# Patient Record
Sex: Female | Born: 1995 | Race: White | Hispanic: No | Marital: Single | Smoking: Never smoker
Health system: Southern US, Community
[De-identification: ages and names within clinical notes are randomized; demographics above are authoritative.]

## PROBLEM LIST (undated history)

## (undated) DIAGNOSIS — F419 Anxiety disorder, unspecified: Secondary | ICD-10-CM

## (undated) HISTORY — DX: Anxiety disorder, unspecified: F41.9

## (undated) HISTORY — PX: TONSILLECTOMY: SUR1361

---
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
## 2018-04-08 ENCOUNTER — Emergency Department
Admission: EM | Admit: 2018-04-08 | Discharge: 2018-04-08 | Disposition: A | Discharge status: Home / Self Care | Payer: 59 | Attending: Emergency Medicine | Admitting: Emergency Medicine

## 2018-04-08 ENCOUNTER — Other Ambulatory Visit: Payer: Self-pay

## 2018-04-08 ENCOUNTER — Encounter: Payer: Self-pay | Admitting: Emergency Medicine

## 2018-04-08 DIAGNOSIS — Y929 Unspecified place or not applicable: Secondary | ICD-10-CM | POA: Insufficient documentation

## 2018-04-08 DIAGNOSIS — S61314A Laceration without foreign body of right ring finger with damage to nail, initial encounter: Secondary | ICD-10-CM | POA: Insufficient documentation

## 2018-04-08 DIAGNOSIS — W231XXA Caught, crushed, jammed, or pinched between stationary objects, initial encounter: Secondary | ICD-10-CM | POA: Diagnosis not present

## 2018-04-08 DIAGNOSIS — S61309A Unspecified open wound of unspecified finger with damage to nail, initial encounter: Secondary | ICD-10-CM

## 2018-04-08 DIAGNOSIS — S6991XA Unspecified injury of right wrist, hand and finger(s), initial encounter: Secondary | ICD-10-CM | POA: Diagnosis present

## 2018-04-08 DIAGNOSIS — Y999 Unspecified external cause status: Secondary | ICD-10-CM | POA: Diagnosis not present

## 2018-04-08 DIAGNOSIS — Y9389 Activity, other specified: Secondary | ICD-10-CM | POA: Insufficient documentation

## 2018-04-08 MED ORDER — BUPIVACAINE HCL (PF) 0.5 % IJ SOLN
INTRAMUSCULAR | Status: AC
  Administered 2018-04-08: 30 mL
  Filled 2018-04-08: qty 30

## 2018-04-08 MED ORDER — BUPIVACAINE HCL (PF) 0.5 % IJ SOLN
30.0000 mL | Freq: Once | INTRAMUSCULAR | Status: AC
  Administered 2018-04-08: 30 mL

## 2018-04-08 NOTE — Discharge Instructions (Signed)
Please keep your nail taped down for the next few days.  It is possible that your nail will fall off however if it does it should not be painful.  We're trying to protect your skin by keeping the nail on as a biological Band-Aid.  Follow-up with your primary care physician as needed and return to the emergency department for any concerns.  It was a pleasure to take care of you today, and thank you for coming to our emergency department.  If you have any questions or concerns before leaving please ask the nurse to grab me and I'm more than happy to go through your aftercare instructions again.  If you have any concerns once you are home that you are not improving or are in fact getting worse before you can make it to your follow-up appointment, please do not hesitate to call 911 and come back for further evaluation.  Merrily Brittle, MD

## 2018-04-08 NOTE — ED Triage Notes (Signed)
Patient ambulatory to triage with steady gait, without difficulty or distress noted; pt reports caught right 4th finger on door, causing nailbed injury (has acrylic nails in place)

## 2018-04-08 NOTE — ED Provider Notes (Signed)
[facility] Emergency Department Provider Note  ____________________________________________   First MD Initiated Contact with Patient 04/08/18 [phone]     (approximate)  I have reviewed the triage vital signs and the nursing notes.   HISTORY  Chief Complaint Nail Problem    HPI Diana Hickman is a 22 y.o. female who comes to the emergency department with sudden onset severe pain to her right ring finger after avulsing back the nail shortly prior to arrival.  It did bleed briefly.  The pain is now worse  with movement and somewhat improved when not touching it.  She is wearing acrylic nails that she had placed for a recent wedding.  She is not used to having nails as long and this morning got caught and bent her nail backwards.  She is concerned that she is going to lose the entire nail.   History reviewed. No pertinent past medical history.  There are no active problems to display for this patient.   Past Surgical History:  Procedure Laterality Date  . TONSILLECTOMY      Prior to Admission medications   Not on File    Allergies Adhesive [tape]  No family history on file.  Social History Social History   Tobacco Use  . Smoking status: Never Smoker  . Smokeless tobacco: Never Used  Substance Use Topics  . Alcohol use: Not on file  . Drug use: Not on file    Review of Systems Constitutional: No fever/chills Cardiovascular: Denies chest pain. Respiratory: Denies shortness of breath. Gastrointestinal: No abdominal pain.  No nausea, no vomiting.   Musculoskeletal: Positive for nail pain Neurological: Negative for headaches   ____________________________________________   PHYSICAL EXAM:  VITAL SIGNS: ED Triage Vitals  Enc Vitals Group     BP 04/08/18 0500 (!) 127/92     Pulse Rate 04/08/18 0500 99     Resp 04/08/18 0500 20     Temp 04/08/18 0500 97.8 F (36.6 C)     Temp Source 04/08/18 0500 Oral     SpO2 04/08/18 0500 100  %     Weight 04/08/18 0500 175 lb (79.4 kg)     Height 04/08/18 0500 5\' 4"  (1.626 m)     Head Circumference --      Peak Flow --      Pain Score 04/08/18 0459 9     Pain Loc --      Pain Edu? --      Excl. in GC? --     Constitutional: Alert and oriented x4 tearful and quite uncomfortable appearing Head: Atraumatic. Cardiovascular: Regular rate and rhythm Respiratory: Normal respiratory effort.  No retractions. MSK: Exquisitely tender to right ring finger nail.  Dried blood around the edges.  On evaluation the patient does appear to have avulsed the nail although not completely and it comes back down nicely.  Neurovascularly intact Neurologic:  Normal speech and language. No gross focal neurologic deficits are appreciated.  Skin:  Skin is warm, dry and intact. No rash noted.    ____________________________________________  LABS (all labs ordered are listed, but only abnormal results are displayed)  Labs Reviewed - No data to display   __________________________________________  EKG   ____________________________________________  RADIOLOGY   ____________________________________________   DIFFERENTIAL includes but not limited to  Nail avulsion, subungual hematoma, nailbed laceration   PROCEDURES  Procedure(s) performed: Yes .Nerve Block Date/Time: 04/08/2018 8:23 AM Performed by: Merrily Brittle, MD Authorized by: Merrily Brittle, MD   Consent:  Consent obtained:  Verbal   Consent given by:  Patient   Risks discussed:  Allergic reaction, pain, nerve damage, unsuccessful block and swelling Indications:    Indications:  Pain relief and procedural anesthesia Location:    Body area:  Upper extremity   Upper extremity nerve blocked: Ring finger digital block.   Laterality:  Right Pre-procedure details:    Skin preparation:  Alcohol Skin anesthesia (see MAR for exact dosages):    Skin anesthesia method:  None Procedure details (see MAR for exact dosages):     Block needle gauge:  25 G   Anesthetic injected:  Bupivacaine 0.5% w/o epi   Steroid injected:  None   Additive injected:  None   Injection procedure:  Anatomic landmarks identified, anatomic landmarks palpated, incremental injection and negative aspiration for blood   Paresthesia:  Immediately resolved Post-procedure details:    Dressing:  None   Outcome:  Anesthesia achieved   Patient tolerance of procedure:  Tolerated well, no immediate complications .Nail Removal Date/Time: 04/08/2018 8:24 AM Performed by: Merrily Brittle, MD Authorized by: Merrily Brittle, MD   Consent:    Consent obtained:  Verbal   Consent given by:  Patient   Risks discussed:  Bleeding, pain and permanent nail deformity   Alternatives discussed:  Alternative treatment Location:    Hand:  R ring finger Pre-procedure details:    Skin preparation:  Alcohol Anesthesia (see MAR for exact dosages):    Anesthesia method:  Nerve block Nail Removal:    Nail removed:  Partial   Nail removed location: Trephination. Trephination:    Subungual hematoma drained: yes   Post-procedure details:    Patient tolerance of procedure:  Tolerated well, no immediate complications    Critical Care performed: no  ____________________________________________   INITIAL IMPRESSION / ASSESSMENT AND PLAN / ED COURSE  Pertinent labs & imaging results that were available during my care of the patient were reviewed by me and considered in my medical decision making (see chart for details).   As part of my medical decision making, I reviewed the following data within the electronic MEDICAL RECORD NUMBER History obtained from family if available, nursing notes, old chart and ekg, as well as notes from prior ED visits.  On arrival it was quite difficult to evaluate the patient secondary to pain.  I verbally consented her for digital block and obtained near complete anesthesia using bupivacaine.  I subsequently was able to evaluate and  the patient clearly did avulsed her nail partially although not completely.  It is difficult to tell if there is a subungual hematoma given her acrylic nails however given my concern I performed electrocautery trephination and did express a small amount of subungual hematoma.  I appreciate that she has a reported allergy to tape however she said the paper tape should be fine and I taped down her fingernail with improvement in her symptoms.  I explained to the patient that she very well might lose her nail however right now with the biological splint and I do not think she would benefit from removal.  We will refer her back to primary care.  Strict return precautions been given.      ____________________________________________   FINAL CLINICAL IMPRESSION(S) / ED DIAGNOSES  Final diagnoses:  Avulsion of fingernail, initial encounter      NEW MEDICATIONS STARTED DURING THIS VISIT:  There are no discharge medications for this patient.    Note:  This document was prepared using [employer]  software and may include unintentional dictation errors.      Merrily Brittle, MD 04/08/18 [phone]

## 2019-01-29 ENCOUNTER — Other Ambulatory Visit: Payer: Self-pay | Admitting: *Deleted

## 2019-01-29 DIAGNOSIS — Z20822 Contact with and (suspected) exposure to covid-19: Secondary | ICD-10-CM

## 2019-01-31 LAB — NOVEL CORONAVIRUS, NAA: SARS-CoV-2, NAA: DETECTED — AB

## 2019-03-18 DIAGNOSIS — F419 Anxiety disorder, unspecified: Secondary | ICD-10-CM | POA: Insufficient documentation

## 2019-03-18 DIAGNOSIS — F32A Depression, unspecified: Secondary | ICD-10-CM | POA: Insufficient documentation

## 2019-03-18 DIAGNOSIS — R1084 Generalized abdominal pain: Secondary | ICD-10-CM | POA: Insufficient documentation

## 2020-06-28 ENCOUNTER — Encounter: Payer: Self-pay | Admitting: Obstetrics and Gynecology

## 2020-06-28 ENCOUNTER — Other Ambulatory Visit (HOSPITAL_COMMUNITY)
Admission: RE | Admit: 2020-06-28 | Discharge: 2020-06-28 | Disposition: A | Discharge status: Home / Self Care | Payer: Commercial Managed Care - PPO | Source: Ambulatory Visit | Attending: Obstetrics and Gynecology | Admitting: Obstetrics and Gynecology

## 2020-06-28 ENCOUNTER — Ambulatory Visit (INDEPENDENT_AMBULATORY_CARE_PROVIDER_SITE_OTHER): Payer: Commercial Managed Care - PPO | Admitting: Obstetrics and Gynecology

## 2020-06-28 ENCOUNTER — Other Ambulatory Visit: Payer: Self-pay

## 2020-06-28 VITALS — BP 115/70 | Ht 65.0 in | Wt 206.2 lb

## 2020-06-28 DIAGNOSIS — Z124 Encounter for screening for malignant neoplasm of cervix: Secondary | ICD-10-CM

## 2020-06-28 DIAGNOSIS — Z1231 Encounter for screening mammogram for malignant neoplasm of breast: Secondary | ICD-10-CM | POA: Diagnosis not present

## 2020-06-28 DIAGNOSIS — Z01419 Encounter for gynecological examination (general) (routine) without abnormal findings: Secondary | ICD-10-CM | POA: Diagnosis not present

## 2020-06-28 DIAGNOSIS — Z0000 Encounter for general adult medical examination without abnormal findings: Secondary | ICD-10-CM

## 2020-06-28 DIAGNOSIS — Z113 Encounter for screening for infections with a predominantly sexual mode of transmission: Secondary | ICD-10-CM | POA: Insufficient documentation

## 2020-06-28 NOTE — Progress Notes (Signed)
Gynecology Annual Exam  PCP: Mick Sell, MD  Chief Complaint:  Chief Complaint  Patient presents with  . Gynecologic Exam    Annual exam     History of Present Illness: Patient is a 25 y.o. No obstetric history on file. presents for annual exam. The patient has no complaints today.   LMP: Patient's last menstrual period was 06/23/2020. Generally has amenorrhea with Nexplanon, reports it was inserted in January 2018 Duration of flow: 0 days Heavy Menses: no Intermenstrual Bleeding: no Dysmenorrhea: no    The patient does perform self breast exams.  There is notable family history of breast or ovarian cancer in her family. Paternal Aunt with ovarian cancer  The patient reports her exercise generally consists of "not much".  The patient reports current symptoms of depression and anxiety.   PHQ-9: 13 GAD-7: 19   Review of Systems: Review of Systems  Constitutional: Negative for chills, fever, malaise/fatigue and weight loss.  HENT: Negative for congestion, hearing loss and sinus pain.   Eyes: Negative for blurred vision and double vision.  Respiratory: Negative for cough, sputum production, shortness of breath and wheezing.   Cardiovascular: Negative for chest pain, palpitations, orthopnea and leg swelling.  Gastrointestinal: Negative for abdominal pain, constipation, diarrhea, nausea and vomiting.  Genitourinary: Negative for dysuria, flank pain, frequency, hematuria and urgency.  Musculoskeletal: Negative for back pain, falls and joint pain.  Skin: Negative for itching and rash.  Neurological: Negative for dizziness and headaches.  Psychiatric/Behavioral: Negative for depression, substance abuse and suicidal ideas. The patient is not nervous/anxious.     Past Medical History:  Past Medical History:  Diagnosis Date  . Anxiety     Past Surgical History:  Past Surgical History:  Procedure Laterality Date  . TONSILLECTOMY      Gynecologic History:   Patient's last menstrual period was 06/23/2020. Menarche: 12  History of fibroids, polyps, or ovarian cysts? :   denies History of PCOS? denies Hstory of Endometriosis? Denies- reports she was told by an ER doctor in 2020 that her CT showed she might have endometriosis History of abnormal pap smears? denies Have you had any sexually transmitted infections in the past?  Chlamydia in 2016  She deneis HPV vaccination in the past.   Last Pap: Results were: 06/03/2016- normal per patient  She identifies as a female. She is sexually active with men.   She denies dyspareunia. She denies postcoital bleeding.  She currently uses Nexplanon for contraception.    Obstetric History: No obstetric history on file.  Family History:  History reviewed. No pertinent family history.  Social History:  Social History   Socioeconomic History  . Marital status: Single    Spouse name: Not on file  . Number of children: Not on file  . Years of education: Not on file  . Highest education level: Not on file  Occupational History  . Not on file  Tobacco Use  . Smoking status: Never Smoker  . Smokeless tobacco: Never Used  Vaping Use  . Vaping Use: Never used  Substance and Sexual Activity  . Alcohol use: Yes    Comment: occ  . Drug use: Never  . Sexual activity: Yes    Birth control/protection: None  Other Topics Concern  . Not on file  Social History Narrative  . Not on file   Social Determinants of Health   Financial Resource Strain: Not on file  Food Insecurity: Not on file  Transportation Needs: Not on  file  Physical Activity: Not on file  Stress: Not on file  Social Connections: Not on file  Intimate Partner Violence: Not on file    Allergies:  Allergies  Allergen Reactions  . Adhesive [Tape]     Medications: Prior to Admission medications   Medication Sig Start Date End Date Taking? Authorizing Provider  ALPRAZolam Prudy Feeler) 1 MG tablet Take 1 mg by mouth at bedtime as  needed for anxiety.   Yes [provider]  citalopram (CELEXA) 20 MG tablet Take 20 mg by mouth daily.   Yes [provider]    Physical Exam Vitals: Blood pressure 115/70, height 5\' 5"  (1.651 m), weight 206 lb 3.2 oz (93.5 kg), last menstrual period 06/23/2020.  Physical Exam Constitutional:      Appearance: She is well-developed.  Genitourinary:     Vagina and uterus normal.     There is no lesion on the right labia.     There is no lesion on the left labia.    No lesions in the vagina.     Genitourinary Comments: External: Normal appearing vulva. No lesions noted.  Speculum examination: Normal appearing cervix. No blood in the vaginal vault. No discharge.   Bimanual examination: Uterus midline, non-tender, normal in size, shape and contour.  No CMT. No adnexal masses. No adnexal tenderness. Pelvis not fixed.       Right Adnexa: no mass present.    Left Adnexa: no mass present.    No cervical motion tenderness.  Breasts:     Right: No inverted nipple, mass, nipple discharge or skin change.     Left: No inverted nipple, mass, nipple discharge or skin change.    HENT:     Head: Normocephalic and atraumatic.  Eyes:     Extraocular Movements: EOM normal.  Neck:     Thyroid: No thyromegaly.  Cardiovascular:     Rate and Rhythm: Normal rate and regular rhythm.     Heart sounds: Normal heart sounds.  Pulmonary:     Effort: Pulmonary effort is normal.     Breath sounds: Normal breath sounds.  Abdominal:     General: Bowel sounds are normal. There is no distension.     Palpations: Abdomen is soft. There is no mass.  Musculoskeletal:     Cervical back: Neck supple.  Neurological:     Mental Status: She is alert and oriented to person, place, and time.  Skin:    General: Skin is warm and dry.  Psychiatric:        Mood and Affect: Mood and affect normal.        Behavior: Behavior normal.        Thought Content: Thought content normal.        Judgment:  Judgment normal.  Vitals reviewed.      Female chaperone present for pelvic and breast  portions of the physical exam  Assessment: 25 y.o. No obstetric history on file. routine annual exam  Plan: Problem List Items Addressed This Visit   None   Visit Diagnoses    Encounter for annual routine gynecological examination    -  Primary   Health maintenance examination       Breast cancer screening by mammogram       Cervical cancer screening       Relevant Orders   Cytology - PAP   Screen for STD (sexually transmitted disease)       Relevant Orders   Cytology - PAP  1) STI screening was offered and accepted  2) ASCCP guidelines and rational discussed.  Patient opts for every 3 years screening interval  3) Contraception - would like to return for a Nexplanon exchange. Was offered today but felt too anxious  4) Routine healthcare maintenance including cholesterol, diabetes screening discussed managed by PCP  5) Anxiety and depression- discussed local mental health resources and provided with list.  Managed by PCP currently.   Adelene Idler MD, [facility], [facility] 06/28/2020 4:06 PM

## 2020-06-28 NOTE — Progress Notes (Signed)
Annual Exam

## 2020-06-28 NOTE — Patient Instructions (Addendum)
[facility] [place] for Calcium and Vitamin D  Age (yr) Calcium Recommended Dietary Allowance (mg/day) Vitamin D Recommended Dietary Allowance (international units/day)  9-18 1,300 600  19-50 1,000 600  51-70 1,200 600  71 and older 1,200 800  Data from Institute of Medicine. Dietary reference intakes: calcium, vitamin D. [place], [place]: [employer]; 2011.      Exercising to Stay Healthy To become healthy and stay healthy, it is recommended that you do moderate-intensity and vigorous-intensity exercise. You can tell that you are exercising at a moderate intensity if your heart starts beating faster and you start breathing faster but can still hold a conversation. You can tell that you are exercising at a vigorous intensity if you are breathing much harder and faster and cannot hold a conversation while exercising. Exercising regularly is important. It has many health benefits, such as:  Improving overall fitness, flexibility, and endurance.  Increasing bone density.  Helping with weight control.  Decreasing body fat.  Increasing muscle strength.  Reducing stress and tension.  Improving overall health. How often should I exercise? Choose an activity that you enjoy, and set realistic goals. Your health care provider can help you make an activity plan that works for you. Exercise regularly as told by your health care provider. This may include:  Doing strength training two times a week, such as: ? Lifting weights. ? Using resistance bands. ? Push-ups. ? Sit-ups. ? Yoga.  Doing a certain intensity of exercise for a given amount of time. Choose from these options: ? A total of 150 minutes of moderate-intensity exercise every week. ? A total of 75 minutes of vigorous-intensity exercise every week. ? A mix of moderate-intensity and vigorous-intensity exercise every week. Children, pregnant women, people who have not  exercised regularly, people who are overweight, and older adults may need to talk with a health care provider about what activities are safe to do. If you have a medical condition, be sure to talk with your health care provider before you start a new exercise program. What are some exercise ideas? Moderate-intensity exercise ideas include:  Walking 1 mile (1.6 km) in about 15 minutes.  Biking.  Hiking.  Golfing.  Dancing.  Water aerobics. Vigorous-intensity exercise ideas include:  Walking 4.5 miles (7.2 km) or more in about 1 hour.  Jogging or running 5 miles (8 km) in about 1 hour.  Biking 10 miles (16.1 km) or more in about 1 hour.  Lap swimming.  Roller-skating or in-line skating.  Cross-country skiing.  Vigorous competitive sports, such as football, basketball, and soccer.  Jumping rope.  Aerobic dancing.   What are some everyday activities that can help me to get exercise?  [place] work, such as: ? Pushing a Conservation officer, nature. ? Raking and bagging leaves.  Washing your car.  Pushing a stroller.  Shoveling snow.  Gardening.  Washing windows or floors. How can I be more active in my day-to-day activities?  Use stairs instead of an elevator.  Take a walk during your lunch break.  If you drive, park your car farther away from your work or school.  If you take public transportation, get off one stop early and walk the rest of the way.  Stand up or walk around during all of your indoor phone calls.  Get up, stretch, and walk around every 30 minutes throughout the day.  Enjoy exercise with a friend. Support to continue exercising will help you keep a regular routine of activity.  What guidelines can I follow while exercising?  Before you start a new exercise program, talk with your health care provider.  Do not exercise so much that you hurt yourself, feel dizzy, or get very short of breath.  Wear comfortable clothes and wear shoes with good support.  Drink  plenty of water while you exercise to prevent dehydration or heat stroke.  Work out until your breathing and your heartbeat get faster. Where to find more information  U.S. Department of Health and Human Services: BondedCompany.at  Centers for Disease Control and Prevention (CDC): http://www.wolf.info/ Summary  Exercising regularly is important. It will improve your overall fitness, flexibility, and endurance.  Regular exercise also will improve your overall health. It can help you control your weight, reduce stress, and improve your bone density.  Do not exercise so much that you hurt yourself, feel dizzy, or get very short of breath.  Before you start a new exercise program, talk with your health care provider. This information is not intended to replace advice given to you by your health care provider. Make sure you discuss any questions you have with your health care provider. Document Revised: 05/02/2017 Document Reviewed: 04/10/2017 Elsevier Patient Education  2021 [place].   Budget-Friendly Healthy Eating There are many ways to save money at the grocery store and continue to eat healthy. You can be successful if you:  Plan meals according to your budget.  Make a grocery list and only purchase food according to your grocery list.  Prepare food yourself at home. What are tips for following this plan? Reading food labels  Compare food labels between brand name foods and the store brand. Often the nutritional value is the same, but the store brand is lower cost.  Look for products that do not have added sugar, fat, or salt (sodium). These often cost the same but are healthier for you. Products may be labeled as: ? Sugar-free. ? Nonfat. ? Low-fat. ? Sodium-free. ? Low-sodium.  Look for lean ground beef labeled as at least 92% lean and 8% fat. Shopping  Buy only the items on your grocery list and go only to the areas of the store that have the items on your list.  Use coupons  only for foods and brands you normally buy. Avoid buying items you wouldn't normally buy simply because they are on sale.  Check online and in newspapers for weekly deals.  Buy healthy items from the bulk bins when available, such as herbs, spices, flour, pasta, nuts, and dried fruit.  Buy fruits and vegetables that are in season. Prices are usually lower on in-season produce.  Look at the unit price on the price tag. Use it to compare different brands and sizes to find out which item is the best deal.  Choose healthy items that are often low-cost, such as carrots, potatoes, apples, bananas, and oranges. Dried or canned beans are a low-cost protein source.  Buy in bulk and freeze extra food. Items you can buy in bulk include meats, fish, poultry, frozen fruits, and frozen vegetables.  Avoid buying "ready-to-eat" foods, such as pre-cut fruits and vegetables and pre-made salads.  If possible, shop around to discover where you can find the best prices. Consider other retailers such as dollar stores, larger [employer], local fruit and vegetable stands, and farmers markets.  Do not shop when you are hungry. If you shop while hungry, it may be hard to stick to your list and budget.  Resist impulse buying. Use  your grocery list as your official plan for the week.  Buy a variety of vegetables and fruits by purchasing fresh, frozen, and canned items.  Look at the top and bottom shelves for deals. Foods at eye level (eye level of an adult or child) are usually more expensive.  Be efficient with your time when shopping. The more time you spend at the store, the more money you are likely to spend.  To save money when choosing more expensive foods like meats and dairy: ? Choose cheaper cuts of meat, such as bone-in chicken thighs and drumsticks instead of skinless and boneless chicken. When you are ready to prepare the chicken, you can remove the skin yourself to make it healthier. ? Choose  lean meats like chicken or [place] instead of beef. ? Choose canned seafood, such as tuna, salmon, or sardines. ? Buy eggs as a low-cost source of protein. ? Buy dried beans and peas, such as lentils, split peas, or kidney beans instead of meats. Dried beans and peas are a good alternative source of protein. ? Buy the larger tubs of yogurt instead of individual-sized containers.  Choose water instead of sodas and other sweetened beverages.  Avoid buying chips, cookies, and other "junk food." These items are usually expensive and not healthy.   Cooking  Make extra food and freeze the extras in meal-sized containers or in individual portions for fast meals and snacks.  Pre-cook on days when you have extra time to prepare meals in advance. You can keep these meals in the fridge or freezer and reheat for a quick meal.  When you come home from the grocery store, wash, peel, and cut fruits and vegetables so they are ready to use and eat. This will help reduce food waste. Meal planning  Do not eat out or get fast food. Prepare food at home.  Make a grocery list and make sure to bring it with you to the store. If you have a smart phone, you could use your phone to create your shopping list.  Plan meals and snacks according to a grocery list and budget you create.  Use leftovers in your meal plan for the week.  Look for recipes where you can cook once and make enough food for two meals.  Prepare budget-friendly types of meals like stews, casseroles, and stir-fry dishes.  Try some meatless meals or try "no cook" meals like salads.  Make sure that half your plate is filled with fruits or vegetables. Choose from fresh, frozen, or canned fruits and vegetables. If eating canned, remember to rinse them before eating. This will remove any excess salt added for packaging. Summary  Eating healthy on a budget is possible if you plan your meals according to your budget, purchase according to your  budget and grocery list, and prepare food yourself.  Tips for buying more food on a limited budget include buying generic brands, using coupons only for foods you normally buy, and buying healthy items from the bulk bins when available.  Tips for buying cheaper food to replace expensive food include choosing cheaper, lean cuts of meat, and buying dried beans and peas. This information is not intended to replace advice given to you by your health care provider. Make sure you discuss any questions you have with your health care provider. Document Revised: 03/02/2020 Document Reviewed: 03/02/2020 Elsevier Patient Education  [place] protect organs, store calcium, anchor muscles, and support the whole body. Keeping  your bones strong is important, especially as you get older. You can take actions to help keep your bones strong and healthy. Why is keeping my bones healthy important? Keeping your bones healthy is important because your body constantly replaces bone cells. Cells get old, and new cells take their place. As we age, we lose bone cells because the body may not be able to make enough new cells to replace the old cells. The amount of bone cells and bone tissue you have is referred to as bone mass. The higher your bone mass, the stronger your bones. The aging process leads to an overall loss of bone mass in the body, which can increase the likelihood of:  Joint pain and stiffness.  Broken bones.  A condition in which the bones become weak and brittle (osteoporosis). A large decline in bone mass occurs in older adults. In women, it occurs about the time of menopause.   What actions can I take to keep my bones healthy? Good health habits are important for maintaining healthy bones. This includes eating nutritious foods and exercising regularly. To have healthy bones, you need to get enough of the right minerals and vitamins. Most nutrition experts recommend  getting these nutrients from the foods that you eat. In some cases, taking supplements may also be recommended. Doing certain types of exercise is also important for bone health. What are the nutritional recommendations for healthy bones? Eating a well-balanced diet with plenty of calcium and vitamin D will help to protect your bones. Nutritional recommendations vary from person to person. Ask your health care provider what is healthy for you. Here are some general guidelines. Get enough calcium Calcium is the most important (essential) mineral for bone health. Most people can get enough calcium from their diet, but supplements may be recommended for people who are at risk for osteoporosis. Good sources of calcium include:  Dairy products, such as low-fat or nonfat milk, cheese, and yogurt.  Dark green leafy vegetables, such as bok choy and broccoli.  Calcium-fortified foods, such as orange juice, cereal, bread, soy beverages, and tofu products.  Nuts, such as almonds. Follow these recommended amounts for daily calcium intake:  Children, age 36-3: 700 mg.  Children, age 43-8: 1,000 mg.  Children, age 53-13: 1,300 mg.  Teens, age 22-18: 1,300 mg.  Adults, age 439-50: 1,000 mg.  Adults, age 431-70: ? Men: 1,000 mg. ? Women: 1,200 mg.  Adults, age 51 or older: 1,200 mg.  Pregnant and breastfeeding females: ? Teens: 1,300 mg. ? Adults: 1,000 mg. Get enough vitamin D Vitamin D is the most essential vitamin for bone health. It helps the body absorb calcium. Sunlight stimulates the skin to make vitamin D, so be sure to get enough sunlight. If you live in a cold climate or you do not get outside often, your health care provider may recommend that you take vitamin D supplements. Good sources of vitamin D in your diet include:  Egg yolks.  Saltwater fish.  Milk and cereal fortified with vitamin D. Follow these recommended amounts for daily vitamin D intake:  Children and teens, age 36-18:  600 international units.  Adults, age 6 or younger: 400-800 international units.  Adults, age 366 or older: 800-1,000 international units. Get other important nutrients Other nutrients that are important for bone health include:  Phosphorus. This mineral is found in meat, poultry, dairy foods, nuts, and legumes. The recommended daily intake for adult men and adult women is 700 mg.  Magnesium.  This mineral is found in seeds, nuts, dark green vegetables, and legumes. The recommended daily intake for adult men is 400-420 mg. For adult women, it is 310-320 mg.  Vitamin K. This vitamin is found in green leafy vegetables. The recommended daily intake is 120 mg for adult men and 90 mg for adult women.   What type of physical activity is best for building and maintaining healthy bones? Weight-bearing and strength-building activities are important for building and maintaining healthy bones. Weight-bearing activities cause muscles and bones to work against gravity. Strength-building activities increase the strength of the muscles that support bones. Weight-bearing and muscle-building activities include:  Walking and hiking.  Jogging and running.  Dancing.  Gym exercises.  Lifting weights.  Tennis and racquetball.  Climbing stairs.  Aerobics. Adults should get at least 30 minutes of moderate physical activity on most days. Children should get at least 60 minutes of moderate physical activity on most days. Ask your health care provider what type of exercise is best for you.   How can I find out if my bone mass is low? Bone mass can be measured with an X-ray test called a bone mineral density (BMD) test. This test is recommended for all women who are age 44 or older. It may also be recommended for:  Men who are age 27 or older.  People who are at risk for osteoporosis because of: ? Having bones that break easily. ? Having a long-term disease that weakens bones, such as kidney disease or  rheumatoid arthritis. ? Having menopause earlier than normal. ? Taking medicine that weakens bones, such as steroids, thyroid hormones, or hormone treatment for breast cancer or prostate cancer. ? Smoking. ? Drinking three or more alcoholic drinks a day. If you find that you have a low bone mass, you may be able to prevent osteoporosis or further bone loss by changing your diet and lifestyle. Where can I find more information? For more information, check out the following websites:  National Osteoporosis Foundation: https://carlson-fletcher.info/  [employer] of Health: www.bones.http://www.myers.net/  International Osteoporosis Foundation: Investment banker, operational.iofbonehealth.org Summary  The aging process leads to an overall loss of bone mass in the body, which can increase the likelihood of broken bones and osteoporosis.  Eating a well-balanced diet with plenty of calcium and vitamin D will help to protect your bones.  Weight-bearing and strength-building activities are also important for building and maintaining strong bones.  Bone mass can be measured with an X-ray test called a bone mineral density (BMD) test. This information is not intended to replace advice given to you by your health care provider. Make sure you discuss any questions you have with your health care provider. Document Revised: 06/16/2017 Document Reviewed: 06/16/2017 Elsevier Patient Education  2021 Elsevier Inc.   Managing Anxiety, Adult After being diagnosed with an anxiety disorder, you may be relieved to know why you have felt or behaved a certain way. You may also feel overwhelmed about the treatment ahead and what it will mean for your life. With care and support, you can manage this condition and recover from it. How to manage lifestyle changes Managing stress and anxiety Stress is your body's reaction to life changes and events, both good and bad. Most stress will last just a few hours, but stress can be ongoing and can lead to more  than just stress. Although stress can play a major role in anxiety, it is not the same as anxiety. Stress is usually caused by something external, such  as a deadline, test, or competition. Stress normally passes after the triggering event has ended.  Anxiety is caused by something internal, such as imagining a terrible outcome or worrying that something will go wrong that will devastate you. Anxiety often does not go away even after the triggering event is over, and it can become long-term (chronic) worry. It is important to understand the differences between stress and anxiety and to manage your stress effectively so that it does not lead to an anxious response. Talk with your health care provider or a counselor to learn more about reducing anxiety and stress. He or she may suggest tension reduction techniques, such as:  Music therapy. This can include creating or listening to music that you enjoy and that inspires you.  Mindfulness-based meditation. This involves being aware of your normal breaths while not trying to control your breathing. It can be done while sitting or walking.  Centering prayer. This involves focusing on a word, phrase, or sacred image that means something to you and brings you peace.  Deep breathing. To do this, expand your stomach and inhale slowly through your nose. Hold your breath for 3-5 seconds. Then exhale slowly, letting your stomach muscles relax.  Self-talk. This involves identifying thought patterns that lead to anxiety reactions and changing those patterns.  Muscle relaxation. This involves tensing muscles and then relaxing them. Choose a tension reduction technique that suits your lifestyle and personality. These techniques take time and practice. Set aside 5-15 minutes a day to do them. Therapists can offer counseling and training in these techniques. The training to help with anxiety may be covered by some insurance plans. Other things you can do to manage stress  and anxiety include:  Keeping a stress/anxiety diary. This can help you learn what triggers your reaction and then learn ways to manage your response.  Thinking about how you react to certain situations. You may not be able to control everything, but you can control your response.  Making time for activities that help you relax and not feeling guilty about spending your time in this way.  Visual imagery and yoga can help you stay calm and relax.   Medicines Medicines can help ease symptoms. Medicines for anxiety include:  Anti-anxiety drugs.  Antidepressants. Medicines are often used as a primary treatment for anxiety disorder. Medicines will be prescribed by a health care provider. When used together, medicines, psychotherapy, and tension reduction techniques may be the most effective treatment. Relationships Relationships can play a big part in helping you recover. Try to spend more time connecting with trusted friends and family members. Consider going to couples counseling, taking family education classes, or going to family therapy. Therapy can help you and others better understand your condition. How to recognize changes in your anxiety Everyone responds differently to treatment for anxiety. Recovery from anxiety happens when symptoms decrease and stop interfering with your daily activities at home or work. This may mean that you will start to:  Have better concentration and focus. Worry will interfere less in your daily thinking.  Sleep better.  Be less irritable.  Have more energy.  Have improved memory. It is important to recognize when your condition is getting worse. Contact your health care provider if your symptoms interfere with home or work and you feel like your condition is not improving. Follow these instructions at home: Activity  Exercise. Most adults should do the following: ? Exercise for at least 150 minutes each week. The exercise should increase  your heart  rate and make you sweat (moderate-intensity exercise). ? Strengthening exercises at least twice a week.  Get the right amount and quality of sleep. Most adults need 7-9 hours of sleep each night. Lifestyle  Eat a healthy diet that includes plenty of vegetables, fruits, whole grains, low-fat dairy products, and lean protein. Do not eat a lot of foods that are high in solid fats, added sugars, or salt.  Make choices that simplify your life.  Do not use any products that contain nicotine or tobacco, such as cigarettes, e-cigarettes, and chewing tobacco. If you need help quitting, ask your health care provider.  Avoid caffeine, alcohol, and certain over-the-counter cold medicines. These may make you feel worse. Ask your pharmacist which medicines to avoid.   General instructions  Take over-the-counter and prescription medicines only as told by your health care provider.  Keep all follow-up visits as told by your health care provider. This is important. Where to find support You can get help and support from these sources:  Self-help groups.  Online and [employer].  A trusted spiritual leader.  Couples counseling.  Family education classes.  Family therapy. Where to find more information You may find that joining a support group helps you deal with your anxiety. The following sources can help you locate counselors or support groups near you:  Mental Health America: www.mentalhealthamerica.net  Anxiety and Depression Association of [place] (ADAA): ProgramCam.de  [employer] on Mental Illness (NAMI): www.nami.org Contact a health care provider if you:  Have a hard time staying focused or finishing daily tasks.  Spend many hours a day feeling worried about everyday life.  Become exhausted by worry.  Start to have headaches, feel tense, or have nausea.  Urinate more than normal.  Have diarrhea. Get help right away if you have:  A racing heart and  shortness of breath.  Thoughts of hurting yourself or others. If you ever feel like you may hurt yourself or others, or have thoughts about taking your own life, get help right away. You can go to your nearest emergency department or call:  Your local emergency services (911 in the [place]  A suicide crisis helpline, such as the National Suicide Prevention Lifeline at 5085746153. This is open 24 hours a day. Summary  Taking steps to learn and use tension reduction techniques can help calm you and help prevent triggering an anxiety reaction.  When used together, medicines, psychotherapy, and tension reduction techniques may be the most effective treatment.  Family, friends, and partners can play a big part in helping you recover from an anxiety disorder. This information is not intended to replace advice given to you by your health care provider. Make sure you discuss any questions you have with your health care provider. Document Revised: 10/20/2018 Document Reviewed: 10/20/2018 Elsevier Patient Education  2021 [employer]   http://APA.org/depression-guideline"> https://clinicalkey.com"> http://point-of-care.elsevierperformancemanager.com/skills/"> http://point-of-care.elsevierperformancemanager.com">  Managing Depression, Adult Depression is a mental health condition that affects your thoughts, feelings, and actions. Being diagnosed with depression can bring you relief if you did not know why you have felt or behaved a certain way. It could also leave you feeling overwhelmed with uncertainty about your future. Preparing yourself to manage your symptoms can help you feel more positive about your future. How to manage lifestyle changes Managing stress Stress is your body's reaction to life changes and events, both good and bad. Stress can add to your feelings of depression. Learning to manage your stress can help lessen your feelings  of depression. Try some of the following approaches  to reducing your stress (stress reduction techniques):  Listen to music that you enjoy and that inspires you.  Try using a meditation app or take a meditation class.  Develop a practice that helps you connect with your spiritual self. Walk in nature, pray, or go to a place of worship.  Do some deep breathing. To do this, inhale slowly through your nose. Pause at the top of your inhale for a few seconds and then exhale slowly, letting your muscles relax.  Practice yoga to help relax and work your muscles. Choose a stress reduction technique that suits your lifestyle and personality. These techniques take time and practice to develop. Set aside 5-15 minutes a day to do them. Therapists can offer training in these techniques. Other things you can do to manage stress include:  Keeping a stress diary.  Knowing your limits and saying no when you think something is too much.  Paying attention to how you react to certain situations. You may not be able to control everything, but you can change your reaction.  Adding humor to your life by watching funny films or TV shows.  Making time for activities that you enjoy and that relax you.   Medicines Medicines, such as antidepressants, are often a part of treatment for depression.  Talk with your pharmacist or health care provider about all the medicines, supplements, and herbal products that you take, their possible side effects, and what medicines and other products are safe to take together.  Make sure to report any side effects you may have to your health care provider. Relationships Your health care provider may suggest family therapy, couples therapy, or individual therapy as part of your treatment. How to recognize changes Everyone responds differently to treatment for depression. As you recover from depression, you may start to:  Have more interest in doing activities.  Feel less hopeless.  Have more energy.  Overeat less often, or  have a better appetite.  Have better mental focus. It is important to recognize if your depression is not getting better or is getting worse. The symptoms you had in the beginning may return, such as:  Tiredness (fatigue) or low energy.  Eating too much or too little.  Sleeping too much or too little.  Feeling restless, agitated, or hopeless.  Trouble focusing or making decisions.  Unexplained physical complaints.  Feeling irritable, angry, or aggressive. If you or your family members notice these symptoms coming back, let your health care provider know right away. Follow these instructions at home: Activity  Try to get some form of exercise each day, such as walking, biking, swimming, or lifting weights.  Practice stress reduction techniques.  Engage your mind by taking a class or doing some volunteer work.   Lifestyle  Get the right amount and quality of sleep.  Cut down on using caffeine, tobacco, alcohol, and other potentially harmful substances.  Eat a healthy diet that includes plenty of vegetables, fruits, whole grains, low-fat dairy products, and lean protein. Do not eat a lot of foods that are high in solid fats, added sugars, or salt (sodium). General instructions  Take over-the-counter and prescription medicines only as told by your health care provider.  Keep all follow-up visits as told by your health care provider. This is important. Where to find support Talking to others Friends and family members can be sources of support and guidance. Talk to trusted friends or family members about your  condition. Explain your symptoms to them, and let them know that you are working with a health care provider to treat your depression. Tell friends and family members how they also can be helpful.   Finances  Find appropriate mental health providers that fit with your financial situation.  Talk with your health care provider about options to get reduced prices on your  medicines. Where to find more information You can find support in your area from:  Anxiety and Depression Association of America (ADAA): www.adaa.org  Mental Health America: www.mentalhealthamerica.net  The [employer] on Mental Illness: www.nami.org Contact a health care provider if:  You stop taking your antidepressant medicines, and you have any of these symptoms: ? Nausea. ? Headache. ? Light-headedness. ? Chills and body aches. ? Not being able to sleep (insomnia).  You or your friends and family think your depression is getting worse. Get help right away if:  You have thoughts of hurting yourself or others. If you ever feel like you may hurt yourself or others, or have thoughts about taking your own life, get help right away. Go to your nearest emergency department or:  Call your local emergency services (911 in the [place]  Call a suicide crisis helpline, such as the National Suicide Prevention Lifeline at 726-685-9367. This is open 24 hours a day in the [place]  Text the Crisis Text Line at (214) 541-1204 (in the [place] Summary  If you are diagnosed with depression, preparing yourself to manage your symptoms is a good way to feel positive about your future.  Work with your health care provider on a management plan that includes stress reduction techniques, medicines (if applicable), therapy, and healthy lifestyle habits.  Keep talking with your health care provider about how your treatment is working.  If you have thoughts about taking your own life, call a suicide crisis helpline or text a crisis text line. This information is not intended to replace advice given to you by your health care provider. Make sure you discuss any questions you have with your health care provider. Document Revised: 03/31/2019 Document Reviewed: 03/31/2019 Elsevier Patient Education  2021 [employer].     [facility] [place] Same Day Access Hours:  Monday, Wednesday and Friday,  8am - 3pm Walk-In Crisis Hours: 7 days/wk, 8am - 8pm [address], [place], [place] [address] [phone]  [employer] [phone]  Mobile Crisis Unit [phone]   Therapists/Counselors/Psychologists  [facility] [address] [facility] [address] [place], [place] [address] [phone]  Karen [place], [place]  & Jacqlyn Krauss [place]    [phone]        [address]       [place], [place] [address]        Ival Bible, CSW [phone] [address] [place], [place] [address]  Harle Battiest, [place]        [phone]        [address]      [place], [place] [address]        Chyrel Masson, MS [phone] [address] [place], [place] [address]   Oscar La, LMFT       [phone]        [address]       [place], [place] [address]        Felecia Jan [phone] [address] [place], [place] [address]  Lester [place]        [phone]  [address]       [place], [place] [address]        Kerin SalenBart McCormick [phone] [address] [place], [place] [address]  Tyron RussellCristin Saffo, PsyD       [phone]        [address]       [place], [place] [address]        Elita QuickCheryl Lawson, LPC [phone] [address]  [place], [place] [address]   Debarah CrapeCourtney Jones        [phone]        [address]      [place], [place] [address]         Rosana HoesLaura Ellington [facility] [phone] [email or web address]   Sation Konchella       [phone]        [address]       [place], [place] [address]        Morton StallCarmen [facility] [phone] [email or web address]

## 2020-07-03 LAB — CYTOLOGY - PAP
Chlamydia: NEGATIVE
Comment: NEGATIVE
Comment: NEGATIVE
Comment: NORMAL
Diagnosis: NEGATIVE
Neisseria Gonorrhea: NEGATIVE
Trichomonas: NEGATIVE

## 2020-07-07 ENCOUNTER — Ambulatory Visit: Payer: Commercial Managed Care - PPO | Admitting: Obstetrics and Gynecology

## 2020-07-09 ENCOUNTER — Emergency Department: Payer: Commercial Managed Care - PPO

## 2020-07-09 ENCOUNTER — Emergency Department
Admission: EM | Admit: 2020-07-09 | Discharge: 2020-07-09 | Disposition: A | Discharge status: Home / Self Care | Payer: Commercial Managed Care - PPO | Attending: Emergency Medicine | Admitting: Emergency Medicine

## 2020-07-09 ENCOUNTER — Encounter: Payer: Self-pay | Admitting: Emergency Medicine

## 2020-07-09 DIAGNOSIS — Y92511 Restaurant or cafe as the place of occurrence of the external cause: Secondary | ICD-10-CM | POA: Insufficient documentation

## 2020-07-09 DIAGNOSIS — S0990XA Unspecified injury of head, initial encounter: Secondary | ICD-10-CM | POA: Diagnosis present

## 2020-07-09 DIAGNOSIS — Z79899 Other long term (current) drug therapy: Secondary | ICD-10-CM | POA: Diagnosis not present

## 2020-07-09 MED ORDER — LORAZEPAM 1 MG PO TABS
1.0000 mg | ORAL_TABLET | Freq: Once | ORAL | Status: AC
  Administered 2020-07-09: 1 mg via ORAL
  Filled 2020-07-09: qty 1

## 2020-07-09 MED ORDER — HYDROCODONE-ACETAMINOPHEN 5-325 MG PO TABS: 1.0000 | ORAL_TABLET | Freq: Four times a day (QID) | ORAL | 0 refills | Status: DC | PRN

## 2020-07-09 MED ORDER — ONDANSETRON 4 MG PO TBDP: 4.0000 mg | ORAL_TABLET | Freq: Three times a day (TID) | ORAL | 0 refills | Status: DC | PRN

## 2020-07-09 MED ORDER — IBUPROFEN 800 MG PO TABS
800.0000 mg | ORAL_TABLET | Freq: Once | ORAL | Status: AC
  Administered 2020-07-09: 800 mg via ORAL
  Filled 2020-07-09: qty 1
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
## 2020-07-09 NOTE — ED Notes (Signed)
Patient refusing CT at this time. Stating that they are to expensive.

## 2020-07-09 NOTE — Discharge Instructions (Addendum)
You may take Ibuprofen as needed for pain, Norco as needed for more severe pain. You may take Zofran as needed for nausea. Apply ice to affected area several times daily. Return to the ER for worsening symptoms, persistent vomiting, difficulty breathing or other concerns.
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
## 2020-07-09 NOTE — ED Notes (Signed)
ROBBIE Rosales (BROTHER)  CALL FOR RIDE WHEN D/C  [phone]

## 2020-07-17 ENCOUNTER — Other Ambulatory Visit: Payer: Self-pay

## 2020-07-17 ENCOUNTER — Encounter: Payer: Self-pay | Admitting: Obstetrics and Gynecology

## 2020-07-17 ENCOUNTER — Ambulatory Visit (INDEPENDENT_AMBULATORY_CARE_PROVIDER_SITE_OTHER): Payer: Commercial Managed Care - PPO | Admitting: Obstetrics and Gynecology

## 2020-07-17 VITALS — BP 118/74 | Ht 64.0 in | Wt 208.4 lb

## 2020-07-17 DIAGNOSIS — Z3046 Encounter for surveillance of implantable subdermal contraceptive: Secondary | ICD-10-CM

## 2020-07-17 NOTE — Patient Instructions (Signed)
Etonogestrel implant What is this medicine? ETONOGESTREL (et oh noe JES trel) is a contraceptive (birth control) device. It is used to prevent pregnancy. It can be used for up to 3 years. This medicine may be used for other purposes; ask your health care provider or pharmacist if you have questions. COMMON BRAND NAME(S): Implanon, Nexplanon What should I tell my health care provider before I take this medicine? They need to know if you have any of these conditions:  abnormal vaginal bleeding  blood vessel disease or blood clots  breast, cervical, endometrial, ovarian, liver, or uterine cancer  diabetes  gallbladder disease  heart disease or recent heart attack  high blood pressure  high cholesterol or triglycerides  kidney disease  liver disease  migraine headaches  seizures  stroke  tobacco smoker  an unusual or allergic reaction to etonogestrel, anesthetics or antiseptics, other medicines, foods, dyes, or preservatives  pregnant or trying to get pregnant  breast-feeding How should I use this medicine? This device is inserted just under the skin on the inner side of your upper arm by a health care professional. Talk to your pediatrician regarding the use of this medicine in children. Special care may be needed. Overdosage: If you think you have taken too much of this medicine contact a poison control center or emergency room at once. NOTE: This medicine is only for you. Do not share this medicine with others. What if I miss a dose? This does not apply. What may interact with this medicine? Do not take this medicine with any of the following medications:  amprenavir  fosamprenavir This medicine may also interact with the following medications:  acitretin  aprepitant  armodafinil  bexarotene  bosentan  carbamazepine  certain medicines for fungal infections like fluconazole, ketoconazole, itraconazole and voriconazole  certain medicines to treat  hepatitis, HIV or AIDS  cyclosporine  felbamate  griseofulvin  lamotrigine  modafinil  oxcarbazepine  phenobarbital  phenytoin  primidone  rifabutin  rifampin  rifapentine  St. John's wort  topiramate This list may not describe all possible interactions. Give your health care provider a list of all the medicines, herbs, non-prescription drugs, or dietary supplements you use. Also tell them if you smoke, drink alcohol, or use illegal drugs. Some items may interact with your medicine. What should I watch for while using this medicine? This product does not protect you against HIV infection (AIDS) or other sexually transmitted diseases. You should be able to feel the implant by pressing your fingertips over the skin where it was inserted. Contact your doctor if you cannot feel the implant, and use a non-hormonal birth control method (such as condoms) until your doctor confirms that the implant is in place. Contact your doctor if you think that the implant may have broken or become bent while in your arm. You will receive a user card from your health care provider after the implant is inserted. The card is a record of the location of the implant in your upper arm and when it should be removed. Keep this card with your health records. What side effects may I notice from receiving this medicine? Side effects that you should report to your doctor or health care professional as soon as possible:  allergic reactions like skin rash, itching or hives, swelling of the face, lips, or tongue  breast lumps, breast tissue changes, or discharge  breathing problems  changes in emotions or moods  coughing up blood  if you feel that the implant   may have broken or bent while in your arm  high blood pressure  pain, irritation, swelling, or bruising at the insertion site  scar at site of insertion  signs of infection at the insertion site such as fever, and skin redness, pain or  discharge  signs and symptoms of a blood clot such as breathing problems; changes in vision; chest pain; severe, sudden headache; pain, swelling, warmth in the leg; trouble speaking; sudden numbness or weakness of the face, arm or leg  signs and symptoms of liver injury like dark yellow or brown urine; general ill feeling or flu-like symptoms; light-colored stools; loss of appetite; nausea; right upper belly pain; unusually weak or tired; yellowing of the eyes or skin  unusual vaginal bleeding, discharge Side effects that usually do not require medical attention (report to your doctor or health care professional if they continue or are bothersome):  acne  breast pain or tenderness  headache  irregular menstrual bleeding  nausea This list may not describe all possible side effects. Call your doctor for medical advice about side effects. You may report side effects to FDA at 1-800-FDA-1088. Where should I keep my medicine? This drug is given in a hospital or clinic and will not be stored at home. NOTE: This sheet is a summary. It may not cover all possible information. If you have questions about this medicine, talk to your doctor, pharmacist, or health care provider.  2021 Elsevier/Gold Standard (2019-03-02 11:33:04)  

## 2020-07-17 NOTE — Progress Notes (Signed)
Nexplanon Removal and Insertion Procedure Note Removal: Appropriate time out taken. Patient placed in dorsal supine with left arm above head, elbow flexed at 90 degrees, arm resting on examination table.  The Nexplanon was noted in the patient's arm and the end was identified. The skin was cleansed with a Betadine solution. A small injection of subcutaneous lidocaine with epinephrine was given over the end of the implant. An incision was made at the end of the implant. The rod was noted in the incision and grasped with a hemostat. It was noted to be intact.    Insertion:The bicipital grove was palpated and site 8-10cm proximal to the medial epicondyle and 3-5 cm below the grove was identified.  Nexplanon removed from sterile blister packaging,  Device confirmed in needle, before inserting full length of needle, tenting up the skin as the needle was advance.  The drug eluting rod was then deployed by pulling back the slider per the manufactures recommendation.  The implant was palpable by the clinician as well as the patient.  The insertion site covered dressed with Dermabond glue before applying  a kerlex bandage pressure dressing. Patient has an adhesive allergy.  Minimal blood loss was noted during the procedure.  The patient tolerated the procedure well.    Adelene Idler MD, [facility], [facility] 07/17/2020 3:50 PM

## 2021-05-02 DIAGNOSIS — G5601 Carpal tunnel syndrome, right upper limb: Secondary | ICD-10-CM | POA: Insufficient documentation

## 2021-07-31 ENCOUNTER — Ambulatory Visit: Payer: BC Managed Care – PPO | Admitting: Obstetrics

## 2021-07-31 ENCOUNTER — Other Ambulatory Visit: Payer: Self-pay

## 2021-07-31 ENCOUNTER — Other Ambulatory Visit (HOSPITAL_COMMUNITY)
Admission: RE | Admit: 2021-07-31 | Discharge: 2021-07-31 | Disposition: A | Discharge status: Home / Self Care | Payer: BC Managed Care – PPO | Source: Ambulatory Visit | Attending: Obstetrics | Admitting: Obstetrics

## 2021-07-31 VITALS — BP 122/70 | Ht 65.0 in | Wt 208.0 lb

## 2021-07-31 DIAGNOSIS — R102 Pelvic and perineal pain: Secondary | ICD-10-CM

## 2021-08-02 ENCOUNTER — Encounter: Payer: Self-pay | Admitting: Obstetrics

## 2021-08-02 LAB — CERVICOVAGINAL ANCILLARY ONLY
Bacterial Vaginitis (gardnerella): POSITIVE — AB
Chlamydia: NEGATIVE
Comment: NEGATIVE
Comment: NEGATIVE
Comment: NEGATIVE
Comment: NORMAL
Neisseria Gonorrhea: NEGATIVE
Trichomonas: NEGATIVE

## 2021-08-03 ENCOUNTER — Other Ambulatory Visit: Payer: Self-pay | Admitting: Obstetrics

## 2021-08-03 DIAGNOSIS — N760 Acute vaginitis: Secondary | ICD-10-CM

## 2021-08-03 DIAGNOSIS — B9689 Other specified bacterial agents as the cause of diseases classified elsewhere: Secondary | ICD-10-CM

## 2021-08-03 MED ORDER — METRONIDAZOLE 500 MG PO TABS: 500.0000 mg | ORAL_TABLET | Freq: Two times a day (BID) | ORAL | 0 refills | Status: AC

## 2021-08-27 ENCOUNTER — Ambulatory Visit: Payer: BC Managed Care – PPO | Admitting: Obstetrics and Gynecology

## 2021-10-07 ENCOUNTER — Encounter: Payer: Self-pay | Admitting: Emergency Medicine

## 2021-10-07 ENCOUNTER — Other Ambulatory Visit: Payer: Self-pay

## 2021-10-07 ENCOUNTER — Encounter
Admission: EM | Disposition: A | Discharge status: Home / Self Care | Payer: Self-pay | Source: Home / Self Care | Attending: Emergency Medicine

## 2021-10-07 ENCOUNTER — Emergency Department: Payer: BC Managed Care – PPO

## 2021-10-07 ENCOUNTER — Emergency Department: Payer: BC Managed Care – PPO | Admitting: Certified Registered"

## 2021-10-07 ENCOUNTER — Ambulatory Visit
Admission: EM | Admit: 2021-10-07 | Discharge: 2021-10-07 | Disposition: A | Discharge status: Home / Self Care | Payer: BC Managed Care – PPO | Attending: Emergency Medicine | Admitting: Emergency Medicine

## 2021-10-07 DIAGNOSIS — K3580 Unspecified acute appendicitis: Secondary | ICD-10-CM | POA: Insufficient documentation

## 2021-10-07 DIAGNOSIS — E8889 Other specified metabolic disorders: Secondary | ICD-10-CM

## 2021-10-07 DIAGNOSIS — F419 Anxiety disorder, unspecified: Secondary | ICD-10-CM | POA: Diagnosis not present

## 2021-10-07 DIAGNOSIS — K353 Acute appendicitis with localized peritonitis, without perforation or gangrene: Secondary | ICD-10-CM

## 2021-10-07 HISTORY — PX: LAPAROSCOPIC APPENDECTOMY: SHX408

## 2021-10-07 LAB — COMPREHENSIVE METABOLIC PANEL
ALT: 103 U/L — ABNORMAL HIGH (ref 0–44)
AST: 52 U/L — ABNORMAL HIGH (ref 15–41)
Albumin: 4.4 g/dL (ref 3.5–5.0)
Alkaline Phosphatase: 51 U/L (ref 38–126)
Anion gap: 9 (ref 5–15)
BUN: 15 mg/dL (ref 6–20)
CO2: 22 mmol/L (ref 22–32)
Calcium: 8.9 mg/dL (ref 8.9–10.3)
Chloride: 105 mmol/L (ref 98–111)
Creatinine, Ser: 0.69 mg/dL (ref 0.44–1.00)
GFR, Estimated: >60 mL/min (ref >60)
Glucose, Bld: 109 mg/dL — ABNORMAL HIGH (ref 70–99)
Potassium: 3.4 mmol/L — ABNORMAL LOW (ref 3.5–5.1)
Sodium: 136 mmol/L (ref 135–145)
Total Bilirubin: 1.5 mg/dL — ABNORMAL HIGH (ref 0.3–1.2)
Total Protein: 7.2 g/dL (ref 6.5–8.1)

## 2021-10-07 LAB — URINALYSIS, ROUTINE W REFLEX MICROSCOPIC
Bilirubin Urine: NEGATIVE
Glucose, UA: NEGATIVE mg/dL
Ketones, ur: 20 mg/dL — AB
Leukocytes,Ua: NEGATIVE
Nitrite: NEGATIVE
Protein, ur: NEGATIVE mg/dL
Specific Gravity, Urine: 1.029 (ref 1.005–1.030)
pH: 5 (ref 5.0–8.0)

## 2021-10-07 LAB — CBC
HCT: 43.4 % (ref 36.0–46.0)
Hemoglobin: 14.8 g/dL (ref 12.0–15.0)
MCH: 31.8 pg (ref 26.0–34.0)
MCHC: 34.1 g/dL (ref 30.0–36.0)
MCV: 93.3 fL (ref 80.0–100.0)
Platelets: 237 10*3/uL (ref 150–400)
RBC: 4.65 MIL/uL (ref 3.87–5.11)
RDW: 11.4 % — ABNORMAL LOW (ref 11.5–15.5)
WBC: 12.5 10*3/uL — ABNORMAL HIGH (ref 4.0–10.5)
nRBC: 0 % (ref 0.0–0.2)

## 2021-10-07 LAB — LIPASE, BLOOD: Lipase: 32 U/L (ref 11–51)

## 2021-10-07 LAB — POC URINE PREG, ED: Preg Test, Ur: NEGATIVE

## 2021-10-07 SURGERY — APPENDECTOMY, LAPAROSCOPIC
Anesthesia: General | Site: Abdomen

## 2021-10-07 MED ORDER — DEXAMETHASONE SODIUM PHOSPHATE 10 MG/ML IJ SOLN
INTRAMUSCULAR | Status: AC
  Filled 2021-10-07: qty 1

## 2021-10-07 MED ORDER — ACETAMINOPHEN 10 MG/ML IV SOLN
INTRAVENOUS | Status: DC | PRN
  Administered 2021-10-07: 1000 mg via INTRAVENOUS

## 2021-10-07 MED ORDER — DEXTROSE-NACL 5-0.45 % IV SOLN: INTRAVENOUS | Status: DC

## 2021-10-07 MED ORDER — LACTATED RINGERS IV BOLUS
1000.0000 mL | Freq: Once | INTRAVENOUS | Status: AC
  Administered 2021-10-07: 1000 mL via INTRAVENOUS

## 2021-10-07 MED ORDER — FENTANYL CITRATE (PF) 250 MCG/5ML IJ SOLN
INTRAMUSCULAR | Status: AC
  Filled 2021-10-07: qty 5

## 2021-10-07 MED ORDER — METRONIDAZOLE 500 MG/100ML IV SOLN
500.0000 mg | Freq: Once | INTRAVENOUS | Status: AC
  Administered 2021-10-07 (×2): 500 mg via INTRAVENOUS
  Filled 2021-10-07: qty 100

## 2021-10-07 MED ORDER — HYDROCODONE-ACETAMINOPHEN 5-325 MG PO TABS: 1.0000 | ORAL_TABLET | ORAL | 0 refills | Status: DC | PRN

## 2021-10-07 MED ORDER — LIDOCAINE HCL (PF) 2 % IJ SOLN
INTRAMUSCULAR | Status: AC
  Filled 2021-10-07: qty 5

## 2021-10-07 MED ORDER — 0.9 % SODIUM CHLORIDE (POUR BTL) OPTIME
TOPICAL | Status: DC | PRN
Start: 2021-10-07 — End: 2021-10-07
  Administered 2021-10-07: 1000 mL

## 2021-10-07 MED ORDER — FENTANYL CITRATE (PF) 100 MCG/2ML IJ SOLN
INTRAMUSCULAR | Status: AC
  Filled 2021-10-07: qty 2

## 2021-10-07 MED ORDER — ROCURONIUM BROMIDE 100 MG/10ML IV SOLN
INTRAVENOUS | Status: DC | PRN
  Administered 2021-10-07: 60 mg via INTRAVENOUS

## 2021-10-07 MED ORDER — PROPOFOL 10 MG/ML IV BOLUS
INTRAVENOUS | Status: DC | PRN
  Administered 2021-10-07: 150 mg via INTRAVENOUS

## 2021-10-07 MED ORDER — FENTANYL CITRATE (PF) 100 MCG/2ML IJ SOLN
25.0000 ug | INTRAMUSCULAR | Status: DC | PRN
  Administered 2021-10-07: 25 ug via INTRAVENOUS

## 2021-10-07 MED ORDER — SUGAMMADEX SODIUM 200 MG/2ML IV SOLN
INTRAVENOUS | Status: DC | PRN
Start: 2021-10-07 — End: 2021-10-07
  Administered 2021-10-07: 200 mg via INTRAVENOUS

## 2021-10-07 MED ORDER — LACTATED RINGERS IV SOLN: INTRAVENOUS | Status: DC | PRN

## 2021-10-07 MED ORDER — BUPIVACAINE-EPINEPHRINE 0.25% -1:200000 IJ SOLN
INTRAMUSCULAR | Status: DC | PRN
  Administered 2021-10-07: 30 mL

## 2021-10-07 MED ORDER — ONDANSETRON HCL 4 MG/2ML IJ SOLN
INTRAMUSCULAR | Status: DC | PRN
  Administered 2021-10-07: 4 mg via INTRAVENOUS

## 2021-10-07 MED ORDER — ROCURONIUM BROMIDE 10 MG/ML (PF) SYRINGE
PREFILLED_SYRINGE | INTRAVENOUS | Status: AC
  Filled 2021-10-07: qty 10

## 2021-10-07 MED ORDER — CEFTRIAXONE SODIUM 2 G IJ SOLR
2.0000 g | Freq: Once | INTRAMUSCULAR | Status: AC
  Administered 2021-10-07: 2 g via INTRAVENOUS
  Filled 2021-10-07: qty 20

## 2021-10-07 MED ORDER — MORPHINE SULFATE (PF) 4 MG/ML IV SOLN
4.0000 mg | Freq: Once | INTRAVENOUS | Status: AC
  Administered 2021-10-07 (×2): 4 mg via INTRAVENOUS
  Filled 2021-10-07: qty 1

## 2021-10-07 MED ORDER — BUPIVACAINE LIPOSOME 1.3 % IJ SUSP
INTRAMUSCULAR | Status: DC | PRN
  Administered 2021-10-07: 20 mL

## 2021-10-07 MED ORDER — LIDOCAINE HCL (CARDIAC) PF 100 MG/5ML IV SOSY
PREFILLED_SYRINGE | INTRAVENOUS | Status: DC | PRN
  Administered 2021-10-07: 100 mg via INTRAVENOUS

## 2021-10-07 MED ORDER — ONDANSETRON HCL 4 MG/2ML IJ SOLN
4.0000 mg | Freq: Once | INTRAMUSCULAR | Status: AC
  Administered 2021-10-07: 4 mg via INTRAVENOUS
  Filled 2021-10-07: qty 2

## 2021-10-07 MED ORDER — HYDROMORPHONE HCL 1 MG/ML IJ SOLN: 1.0000 mg | Freq: Once | INTRAMUSCULAR | Status: DC

## 2021-10-07 MED ORDER — MIDAZOLAM HCL 2 MG/2ML IJ SOLN
INTRAMUSCULAR | Status: DC | PRN
  Administered 2021-10-07: 2 mg via INTRAVENOUS

## 2021-10-07 MED ORDER — MORPHINE SULFATE (PF) 4 MG/ML IV SOLN
4.0000 mg | Freq: Once | INTRAVENOUS | Status: AC
  Administered 2021-10-07: 4 mg via INTRAVENOUS
  Filled 2021-10-07: qty 1

## 2021-10-07 MED ORDER — KETOROLAC TROMETHAMINE 30 MG/ML IJ SOLN
INTRAMUSCULAR | Status: AC
  Filled 2021-10-07: qty 1

## 2021-10-07 MED ORDER — PROMETHAZINE HCL 25 MG/ML IJ SOLN: 6.2500 mg | INTRAMUSCULAR | Status: DC | PRN

## 2021-10-07 MED ORDER — PROPOFOL 10 MG/ML IV BOLUS
INTRAVENOUS | Status: AC
  Filled 2021-10-07: qty 20

## 2021-10-07 MED ORDER — FENTANYL CITRATE (PF) 100 MCG/2ML IJ SOLN
INTRAMUSCULAR | Status: DC | PRN
  Administered 2021-10-07: 150 ug via INTRAVENOUS
  Administered 2021-10-07: 100 ug via INTRAVENOUS

## 2021-10-07 MED ORDER — IOHEXOL 300 MG/ML  SOLN
100.0000 mL | Freq: Once | INTRAMUSCULAR | Status: AC | PRN
  Administered 2021-10-07: 100 mL via INTRAVENOUS

## 2021-10-07 MED ORDER — ACETAMINOPHEN 10 MG/ML IV SOLN
INTRAVENOUS | Status: AC
  Filled 2021-10-07: qty 100

## 2021-10-07 MED ORDER — DEXAMETHASONE SODIUM PHOSPHATE 10 MG/ML IJ SOLN
INTRAMUSCULAR | Status: DC | PRN
  Administered 2021-10-07: 10 mg via INTRAVENOUS

## 2021-10-07 MED ORDER — ONDANSETRON HCL 4 MG/2ML IJ SOLN
INTRAMUSCULAR | Status: AC
  Filled 2021-10-07: qty 2

## 2021-10-07 MED ORDER — MIDAZOLAM HCL 2 MG/2ML IJ SOLN
INTRAMUSCULAR | Status: AC
  Filled 2021-10-07: qty 2

## 2021-10-07 MED ORDER — KETOROLAC TROMETHAMINE 30 MG/ML IJ SOLN
INTRAMUSCULAR | Status: DC | PRN
  Administered 2021-10-07: 30 mg via INTRAVENOUS

## 2021-10-07 SURGICAL SUPPLY — 40 items
APPLIER CLIP 5 13 M/L LIGAMAX5 (MISCELLANEOUS) ×2
BAG RETRIEVAL 10 (BASKET) ×1
BLADE CLIPPER SURG (BLADE) ×2 IMPLANT
CLIP APPLIE 5 13 M/L LIGAMAX5 (MISCELLANEOUS) ×1 IMPLANT
CUTTER FLEX LINEAR 45M (STAPLE) ×2 IMPLANT
DERMABOND ADVANCED (GAUZE/BANDAGES/DRESSINGS) ×1
DERMABOND ADVANCED .7 DNX12 (GAUZE/BANDAGES/DRESSINGS) ×1 IMPLANT
ELECT CAUTERY BLADE 6.4 (BLADE) ×2 IMPLANT
ELECT REM PT RETURN 9FT ADLT (ELECTROSURGICAL) ×2
ELECTRODE REM PT RTRN 9FT ADLT (ELECTROSURGICAL) ×1 IMPLANT
GLOVE BIO SURGEON STRL SZ7 (GLOVE) ×2 IMPLANT
GOWN STRL REUS W/ TWL LRG LVL3 (GOWN DISPOSABLE) ×2 IMPLANT
GOWN STRL REUS W/TWL LRG LVL3 (GOWN DISPOSABLE) ×2
IRRIGATION STRYKERFLOW (MISCELLANEOUS) ×1 IMPLANT
IRRIGATOR STRYKERFLOW (MISCELLANEOUS) ×2
IV NS 1000ML (IV SOLUTION) ×1
IV NS 1000ML BAXH (IV SOLUTION) ×1 IMPLANT
MANIFOLD NEPTUNE II (INSTRUMENTS) ×2 IMPLANT
NEEDLE HYPO 22GX1.5 SAFETY (NEEDLE) ×2 IMPLANT
NS IRRIG 500ML POUR BTL (IV SOLUTION) ×2 IMPLANT
PACK LAP CHOLECYSTECTOMY (MISCELLANEOUS) ×2 IMPLANT
PENCIL ELECTRO HAND CTR (MISCELLANEOUS) ×2 IMPLANT
RELOAD 45 VASCULAR/THIN (ENDOMECHANICALS) ×2 IMPLANT
RELOAD STAPLE 45 2.5 WHT GRN (ENDOMECHANICALS) ×1 IMPLANT
RELOAD STAPLE 45 3.5 BLU ETS (ENDOMECHANICALS) ×1 IMPLANT
RELOAD STAPLE TA45 3.5 REG BLU (ENDOMECHANICALS) ×2 IMPLANT
SCISSORS METZENBAUM CVD 33 (INSTRUMENTS) IMPLANT
SHEARS HARMONIC ACE PLUS 36CM (ENDOMECHANICALS) ×2 IMPLANT
SLEEVE ENDOPATH XCEL 5M (ENDOMECHANICALS) ×2 IMPLANT
SPONGE T-LAP 18X18 ~~LOC~~+RFID (SPONGE) ×3 IMPLANT
SUT MNCRL AB 4-0 PS2 18 (SUTURE) ×2 IMPLANT
SUT VICRYL 0 AB UR-6 (SUTURE) ×4 IMPLANT
SYR 20ML LL LF (SYRINGE) ×2 IMPLANT
SYS BAG RETRIEVAL 10MM (BASKET) ×1
SYSTEM BAG RETRIEVAL 10MM (BASKET) ×1 IMPLANT
TRAY FOLEY MTR SLVR 16FR STAT (SET/KITS/TRAYS/PACK) ×2 IMPLANT
TROCAR XCEL BLUNT TIP 100MML (ENDOMECHANICALS) ×2 IMPLANT
TROCAR XCEL NON-BLD 5MMX100MML (ENDOMECHANICALS) ×2 IMPLANT
TUBING EVAC SMOKE HEATED PNEUM (TUBING) ×2 IMPLANT
WATER STERILE IRR 500ML POUR (IV SOLUTION) ×2 IMPLANT

## 2021-10-07 NOTE — ED Notes (Signed)
Pt undresses, jewelry removed. Pt placed in gown in stretcher, belongings given to visitor. ?

## 2021-10-07 NOTE — Anesthesia Preprocedure Evaluation (Signed)
Anesthesia Evaluation  ?Patient identified by MRN, date of birth, ID band ?Patient awake ? ? ? ?Reviewed: ?Allergy & Precautions, H&P , NPO status , Patient's Chart, lab work & pertinent test results, reviewed documented beta blocker date and time  ? ?History of Anesthesia Complications ?Negative for: history of anesthetic complications ? ?Airway ?Mallampati: III ? ?TM Distance: >3 FB ?Neck ROM: full ? ? ? Dental ? ?(+) Dental Advidsory Given, Missing ?Permanent retainer on bottom:   ?Pulmonary ?neg shortness of breath, asthma (as a child) , neg sleep apnea, neg COPD, neg recent URI,  ?  ?Pulmonary exam normal ?breath sounds clear to auscultation ? ? ? ? ? ? Cardiovascular ?Exercise Tolerance: Good ?negative cardio ROS ?Normal cardiovascular exam ?Rhythm:regular Rate:Normal ? ? ?  ?Neuro/Psych ?PSYCHIATRIC DISORDERS Anxiety negative neurological ROS ?   ? GI/Hepatic ?negative GI ROS, Neg liver ROS,   ?Endo/Other  ?negative endocrine ROS ? Renal/GU ?negative Renal ROS  ?negative genitourinary ?  ?Musculoskeletal ? ? Abdominal ?  ?Peds ? Hematology ?negative hematology ROS ?(+)   ?Anesthesia Other Findings ?Past Medical History: ?No date: Anxiety ? ? Reproductive/Obstetrics ?negative OB ROS ? ?  ? ? ? ? ? ? ? ? ? ? ? ? ? ?  ?  ? ? ? ? ? ? ? ? ?Anesthesia Physical ?Anesthesia Plan ? ?ASA: 2 ? ?Anesthesia Plan: General  ? ?Post-op Pain Management:   ? ?Induction: Intravenous, Rapid sequence and Cricoid pressure planned ? ?PONV Risk Score and Plan: 3 and Ondansetron, Dexamethasone, Midazolam and Treatment may vary due to age or medical condition ? ?Airway Management Planned: Oral ETT ? ?Additional Equipment:  ? ?Intra-op Plan:  ? ?Post-operative Plan: Extubation in OR ? ?Informed Consent: I have reviewed the patients History and Physical, chart, labs and discussed the procedure including the risks, benefits and alternatives for the proposed anesthesia with the patient or authorized  representative who has indicated his/her understanding and acceptance.  ? ? ? ?Dental Advisory Given ? ?Plan Discussed with: Anesthesiologist, CRNA and Surgeon ? ?Anesthesia Plan Comments:   ? ? ? ? ? ? ?Anesthesia Quick Evaluation ? ?

## 2021-10-07 NOTE — ED Triage Notes (Signed)
Pt reports this am woke up with pain to her RLQ sharp in nature and some nausea ?

## 2021-10-07 NOTE — ED Notes (Signed)
PT drank gatorade at 10am but hasnt eaten since 5pm yesterday. ?

## 2021-10-07 NOTE — Op Note (Signed)
laparascopic appendectomy  ? ?Diana Hickman ?Date of operation:  10/07/2021 ? ?Indications: The patient presented with a history of  abdominal pain. Workup has revealed findings consistent with acute appendicitis. ? ?Pre-operative Diagnosis: Acute appendicitis without mention of peritonitis ? ?Post-operative Diagnosis: Same ? ?Surgeon: Sterling Big, MD, FACS ? ?Anesthesia: General with endotracheal tube ? ?Findings: Acute non perforated appendicitis ? ?Estimated Blood Loss: 5cc ?        ?Specimens: appendix ?        ?Complications:  none ? ?Procedure Details  ?The patient was seen again in the preop area. The options of surgery versus observation were reviewed with the patient and/or family. The risks of bleeding, infection, recurrence of symptoms, negative laparoscopy, potential for an open procedure, bowel injury, abscess or infection, were all reviewed as well. The patient was taken to Operating Room, identified as Diana Hickman and the procedure verified as laparoscopic appendectomy. A Time Out was held and the above information confirmed. ? ?The patient was placed in the supine position and general anesthesia was induced.  Antibiotic prophylaxis was administered and VT E prophylaxis was in place.  ?The abdomen was prepped and draped in a sterile fashion. An infraumbilical incision was made. A cutdown technique was used to enter the abdominal cavity. Two vicryl stitches were placed on the fascia and a Hasson trocar inserted. Pneumoperitoneum obtained. Two 5 mm ports were placed under direct visualization. ? ? The appendix was identified and found to be acutely inflamed  ?The appendix was carefully dissected. The mesoappendix was divided withHarmonic scalpel. ?The base of the appendix was dissected out and divided with a standard load Endo GIA.The appendix was placed in a Endo Catch bag and removed via the Hasson port. The right lower quadrant and pelvis was then irrigated with  normal saline which was  aspirated. Inspection  failed to identify any additional bleeding and there were no signs of bowel injury. Again the right lower quadrant was inspected there was no sign of bleeding or bowel injury therefore pneumoperitoneum was released, all ports were removed. ? ?The umbilical fascia was closed with 0 Vicryl interrupted sutures and the skin incisions were approximated with subcuticular 4-0 Monocryl. Dermabond was placed ?The patient tolerated the procedure well, there were no complications. The sponge lap and needle count were correct at the end of the procedure. Liposomal marcaine injected at all sites. ? ?The patient was taken to the recovery room in stable condition to be admitted for continued care. ? ? ? Sterling Big, MD FACS  ?

## 2021-10-07 NOTE — H&P (Signed)
Patient ID: Diana Hickman, female   DOB: [birth year], 26 y.o.   MRN: [id number] ? ?HPI ?Diana Hickman is a 26 y.o. female seen in consultation at the request of Dr. Katrinka Blazing.  She presented with a cute onset of abdominal pain in the right lower quadrant that woke her from sleep.  Pain is moderate to severe intensity located in the right lower quadrant.  It is sharp and worsening with certain movements.  She did have some nausea and vomiting.  Last p.o. intake was at 10 AM and he was Gatorade.  She had solid meal at 5 PM yesterday.  She denies any fevers any chills.  She is able to perform more than 4 METS of activity without any shortness of breath or chest pain.  She did have some recent dental surgery.  She also had a history of tonsillectomy in the past. ?He did have a CT scan of the abdomen pelvis that I personally reviewed showing evidence of uncomplicated appendicitis.  Lab values show evidence of increasing white count to 12 rest of the labs are normal.  Negative urine pregnancy. ?Fianc? is at the bedside and the live together. ?She works as a Leisure centre manager ? ?HPI ? ?Past Medical History:  ?Diagnosis Date  ? Anxiety   ? ? ?Past Surgical History:  ?Procedure Laterality Date  ? TONSILLECTOMY    ? ? ?No pertinent family history ? ?Social History ?Social History  ? ?Tobacco Use  ? Smoking status: Never  ? Smokeless tobacco: Never  ?Vaping Use  ? Vaping Use: Never used  ?Substance Use Topics  ? Alcohol use: Yes  ?  Comment: occ  ? Drug use: Never  ? ? ?Allergies  ?Allergen Reactions  ? Adhesive [Tape]   ? Doxycycline Other (See Comments)  ?  syncope  ? ? ?Current Facility-Administered Medications  ?Medication Dose Route Frequency Provider Last Rate Last Admin  ? cefTRIAXone (ROCEPHIN) 2 g in sodium chloride 0.9 % 100 mL IVPB  2 g Intravenous Once Gilles Chiquito, MD      ? dextrose 5 %-0.45 % sodium chloride infusion   Intravenous Continuous Gilles Chiquito, MD      ? metroNIDAZOLE (FLAGYL) IVPB 500 mg  500 mg  Intravenous Once Hanako Tipping, [place] F, MD      ? morphine (PF) 4 MG/ML injection 4 mg  4 mg Intravenous Once Gilles Chiquito, MD      ? ?Current Outpatient Medications  ?Medication Sig Dispense Refill  ? ALPRAZolam (XANAX) 1 MG tablet Take 1 mg by mouth at bedtime as needed for anxiety.    ? citalopram (CELEXA) 20 MG tablet Take 20 mg by mouth daily. (Patient not taking: Reported on 07/31/2021)    ? etonogestrel (NEXPLANON) 68 MG IMPL implant Inject into the skin.    ? HYDROcodone-acetaminophen (NORCO) 5-325 MG tablet Take 1 tablet by mouth every 6 (six) hours as needed for moderate pain. (Patient not taking: Reported on 07/31/2021) 5 tablet 0  ? ondansetron (ZOFRAN ODT) 4 MG disintegrating tablet Take 1 tablet (4 mg total) by mouth every 8 (eight) hours as needed for nausea or vomiting. (Patient not taking: Reported on 07/31/2021) 15 tablet 0  ? ? ? ?Review of Systems ?Full ROS  was asked and was negative except for the information on the HPI ? ?Physical Exam ?Blood pressure (!) 131/92, pulse 76, temperature 98.6 ?F (37 ?C), temperature source Oral, resp. rate 18, height 5\' 5"  (1.651 m), weight 95 kg, last menstrual  period 10/06/2021, SpO2 99 %. ?CONSTITUTIONAL: NAD . ?EYES: Pupils are equal, round, , Sclera are non-icteric. ?EARS, NOSE, MOUTH AND THROAT:  The oral mucosa is pink and moist. Hearing is intact to voice. ?LYMPH NODES:  Lymph nodes in the neck are normal. ?RESPIRATORY:  Lungs are clear. There is normal respiratory effort, with equal breath sounds bilaterally, and without pathologic use of accessory muscles. ?CARDIOVASCULAR: Heart is regular without murmurs, gallops, or rubs. ?GI: The abdomen is  soft, tender to palpation in the right lower quadrant with focal peritonitis.  No overt generalized peritonitis.  No masses no hernias no prior scars ? ?GU: Rectal deferred.   ?MUSCULOSKELETAL: Normal muscle strength and tone. No cyanosis or edema.   ?SKIN: Turgor is good and there are no pathologic skin lesions or  ulcers. ?NEUROLOGIC: Motor and sensation is grossly normal. Cranial nerves are grossly intact. ?PSYCH:  Oriented to person, place and time.  She is anxious ? ?Data Reviewed ? ?I have personally reviewed the patient's imaging, laboratory findings and medical records.   ? ?Assessment/Plan ?26 year old female with classic signs and symptoms consistent with acute appendicitis.  Discussed with patient and her fianc? in detail about her disease process.  Given her age and her disease process I do think that her best option is to proceed with appendectomy ?The risks, benefits, complications, treatment options, and expected outcomes were discussed with the patient. .  Also discussed continuing to the operating room for Laparoscopic Appendectomy.  The possibilities of  bleeding, recurrent infection, perforation of viscus, finding a normal appendix, the need for additional procedures, failure to diagnose a condition, conversion to open procedure and creating a complication requiring transfusion or further operations were discussed. The patient was given the opportunity to ask questions and have them answered.  Patient would like to proceed with Laparoscopic Appendectomy and consent was obtained.  ?We will start broad-spectrum antibiotics crystalloids and perform her surgery this afternoon.  Discussed with the OR in detail ?I spent 75 minutes in this encounter including coordination of her care, personally reviewing imaging studies, placing orders and performing appropriate documentation ? ?Sterling Big, MD FACS ?General Surgeon ?10/07/2021, 3:17 PM ? ?  ?

## 2021-10-07 NOTE — Transfer of Care (Signed)
Immediate Anesthesia Transfer of Care Note ? ?Patient: Diana Hickman ? ?Procedure(s) Performed: APPENDECTOMY LAPAROSCOPIC (Abdomen) ? ?Patient Location: PACU ? ?Anesthesia Type:General ? ?Level of Consciousness: awake, alert  and oriented ? ?Airway & Oxygen Therapy: Patient Spontanous Breathing and Patient connected to nasal cannula oxygen ? ?Post-op Assessment: Report given to RN, Post -op Vital signs reviewed and stable and Patient moving all extremities ? ?Post vital signs: Reviewed and stable ? ?Last Vitals:  ?Vitals Value Taken Time  ?BP 133/90 10/07/21 1642  ?Temp 36.3 ?C 10/07/21 1640  ?Pulse 90 10/07/21 1642  ?Resp 25 10/07/21 1642  ?SpO2 92 % 10/07/21 1642  ? ? ?Last Pain:  ?Vitals:  ? 10/07/21 1640  ?TempSrc:   ?PainSc: 3   ?   ? ?  ? ?Complications: No notable events documented. ?

## 2021-10-07 NOTE — Anesthesia Procedure Notes (Signed)
Procedure Name: Intubation ?Date/Time: 10/07/2021 4:03 PM ?Performed by: Esaw Grandchild, CRNA ?Pre-anesthesia Checklist: Patient identified, Emergency Drugs available, Suction available and Patient being monitored ?Patient Re-evaluated:Patient Re-evaluated prior to induction ?Oxygen Delivery Method: Circle system utilized ?Preoxygenation: Pre-oxygenation with 100% oxygen ?Induction Type: IV induction, Rapid sequence and Cricoid Pressure applied ?Ventilation: Mask ventilation without difficulty ?Laryngoscope Size: Sabra Heck and 2 ?Grade View: Grade I ?Tube type: Oral ?Tube size: 7.0 mm ?Number of attempts: 1 ?Airway Equipment and Method: Stylet, Oral airway and Bite block ?Placement Confirmation: ETT inserted through vocal cords under direct vision, positive ETCO2 and breath sounds checked- equal and bilateral ?Secured at: 22 cm ?Tube secured with: Tape ?Dental Injury: Teeth and Oropharynx as per pre-operative assessment  ? ? ? ? ?

## 2021-10-07 NOTE — Discharge Instructions (Addendum)
Laparoscopic Appendectomy, Adult, Care After ?What can I expect after the procedure? ?After a laparoscopic appendectomy, it is common to have: ?Little energy for normal activities. ?Mild pain in the area where the cuts from surgery (incisions) were made. ?Trouble pooping (constipation). This may be caused by: ?Pain medicine. ?A lack of activity. ?Gas pain that can spread to your shoulders. ?Follow these instructions at home: ?Your doctor may give you more instructions. If you have problems, contact your doctor. ?Medicines ?Take over-the-counter and prescription medicines only as told by your doctor. ?Do not stop taking antibiotics even if you start to feel better. ?Take pain medicines before your pain gets very bad. ?If told, take steps to prevent problems with pooping (constipation). You may need to: ?Drink enough fluid to keep your pee (urine) pale yellow. ?Take medicines. You will be told what medicines to take. ?Eat foods that are high in fiber. These include beans, whole grains, and fresh fruits and vegetables. ?Limit foods that are high in fat and sugar. These include fried or sweet foods. ?Ask your doctor if you should avoid driving or using machines while you are taking your medicine. ?Incision care ?Two stitched incisions. One is normal. The other is red with pus and infected. ? ?Follow instructions from your doctor about how to take care of your incisions. Make sure you: ?Wash your hands with soap and water for at least 20 seconds before and after you change your bandage. If you cannot use soap and water, use hand sanitizer. ?Change your bandage. ?Leave stitches, staples, or skin glue in place for at least 2 weeks. ?Leave tape strips alone unless you are told to take them off. You may trim the edges of the tape strips if they curl up. ?Check your incisions every day for signs of infection. Check for: ?Redness, swelling, or pain. ?Fluid or blood. ?Warmth. ?Pus or a bad smell. ?Bathing ?Do not take baths,  swim, or use a hot tub. Ask your doctor about taking showers or sponge baths. ?Keep your incisions clean and dry. Clean them as told by your doctor. To do this: ?Gently wash the incisions with soap and water. ?Rinse the incisions with water to get all the soap off. ?Pat the incisions dry with a clean towel. Do not rub the incisions. ?Activity ?A sign showing that a person should not lift anything heavy. ? ?If you were given a sedative during your procedure, do not drive or use machines until your doctor says that it is safe. A sedative is a medicine that helps you relax. ?Rest as told by your doctor. ?Do not lift anything that is heavier than 10 lb (4.5 kg). ?Do not play contact sports until your doctor says that it is safe. ?Return to your normal activities when your doctor says that it is safe. ?General instructions ?If you were sent home with a drain, follow instructions from your doctor on how to care for it. ?Take deep breaths. This helps to keep your lungs from getting an infection (pneumonia). ?If you need to cough or sneeze, place a pillow or blanket on your belly before you do so. This will help you control the pain. ?Keep all follow-up visits. ?Contact a doctor if: ?You have any of these signs of infection in an incision: ?Redness, swelling, or pain. ?Fluid or blood. ?Warmth. ?Pus or a bad smell. ?Your incisions open after the stitches have been taken out. ?You lose your appetite. ?You feel as if you may vomit. ?You vomit. ?You get watery  poop (diarrhea), or you cannot control your poop. ?You get a rash. ?Get help right away if: ?You have a fever. ?You have trouble breathing. ?You have sharp pains in your chest. ?You have swelling or pain in your legs. ?You faint. ?These symptoms may be an emergency. Get help right away. Call 911. ?Do not wait to see if the symptoms will go away. ?Do not drive yourself to the hospital. ?Summary ?After the procedure, it is common to have low energy, mild pain, trouble  pooping, and gas pain. ?Follow your doctor's instructions about caring for yourself after the procedure. ?Rest after the procedure. Return to your normal activities as told by your doctor. ?Contact your doctor if you see signs of infection around your incisions. Get help right away if you have a fever, chest pain, or trouble breathing. ?This information is not intended to replace advice given to you by your health care provider. Make sure you discuss any questions you have with your health care provider. ?Document Revised: 03/01/2021 Document Reviewed: 03/01/2021 ?Elsevier Patient Education ? 2023 [employer] ?  ? ? ? ?AMBULATORY SURGERY  ?DISCHARGE INSTRUCTIONS ? ? ?The drugs that you were given will stay in your system until tomorrow so for the next 24 hours you should not: ? ?Drive an automobile ?Make any legal decisions ?Drink any alcoholic beverage ? ? ?You may resume regular meals tomorrow.  Today it is better to start with liquids and gradually work up to solid foods. ? ?You may eat anything you prefer, but it is better to start with liquids, then soup and crackers, and gradually work up to solid foods. ? ? ?Please notify your doctor immediately if you have any unusual bleeding, trouble breathing, redness and pain at the surgery site, drainage, fever, or pain not relieved by medication. ? ? ? ?Additional Instructions: ? ? ? ? ? ? ? ?Please contact your physician with any problems or Same Day Surgery at [phone], Monday through Friday 6 am to 4 pm, or [place] at [facility] number at [phone].   ? ?

## 2021-10-07 NOTE — ED Provider Notes (Signed)
? ?[facility] ?Provider Note ? ? ? Event Date/Time  ? First MD Initiated Contact with Patient 10/07/21 1059   ?  (approximate) ? ? ?History  ? ?Abdominal Pain and Nausea ? ? ?HPI ? ?Diana Hickman is a 26 y.o. female with past medical history anxiety who presents accompanied by her partner for evaluation of fairly sudden onset right lower quadrant abdominal pain.  Patient states she felt a little bit of discomfort last night but felt much more severe pain this morning.  She had some nausea and vomiting.  No diarrhea, constipation or abnormal vaginal discharge or burning with urination.  She does note she is on her menstrual period.  No left-sided abdominal pain, back pain, headache and earache, sore throat, fevers, chest pain, cough, shortness of breath or other clear sick symptoms.  No recent significant NSAID use, Tylenol use or EtOH use.  No prior similar episodes.  No clearly fitting aggravating factors. ? ?  ?Past Medical History:  ?Diagnosis Date  ? Anxiety   ? ? ? ?Physical Exam  ?Triage Vital Signs: ?ED Triage Vitals  ?Enc Vitals Group  ?   BP 10/07/21 1034 (!) 145/94  ?   Pulse Rate 10/07/21 1034 96  ?   Resp 10/07/21 1034 18  ?   Temp 10/07/21 1034 98.6 ?F (37 ?C)  ?   Temp Source 10/07/21 1034 Oral  ?   SpO2 10/07/21 1034 93 %  ?   Weight 10/07/21 1016 209 lb 7 oz (95 kg)  ?   Height 10/07/21 1016 5\' 5"  (1.651 m)  ?   Head Circumference --   ?   Peak Flow --   ?   Pain Score 10/07/21 1016 9  ?   Pain Loc --   ?   Pain Edu? --   ?   Excl. in GC? --   ? ? ?Most recent vital signs: ?Vitals:  ? 10/07/21 1335 10/07/21 1530  ?BP: (!) 131/92 (!) 135/97  ?Pulse: 76 82  ?Resp: 18 16  ?Temp:    ?SpO2: 99% 99%  ? ? ?General: Awake, uncomfortable appearing. ?CV:  Good peripheral perfusion.  2+ radial pulse. ?Resp:  Normal effort.  ?Abd:  No distention.  Very tender in the right lower quadrant. ?Other:   ? ? ?ED Results / Procedures / Treatments  ?Labs ?(all labs ordered are listed, but only  abnormal results are displayed) ?Labs Reviewed  ?COMPREHENSIVE METABOLIC PANEL - Abnormal; Notable for the following components:  ?    Result Value  ? Potassium 3.4 (*)   ? Glucose, Bld 109 (*)   ? AST 52 (*)   ? ALT 103 (*)   ? Total Bilirubin 1.5 (*)   ? All other components within normal limits  ?CBC - Abnormal; Notable for the following components:  ? WBC 12.5 (*)   ? RDW 11.4 (*)   ? All other components within normal limits  ?URINALYSIS, ROUTINE W REFLEX MICROSCOPIC - Abnormal; Notable for the following components:  ? Color, Urine YELLOW (*)   ? APPearance HAZY (*)   ? Hgb urine dipstick SMALL (*)   ? Ketones, ur 20 (*)   ? Bacteria, UA RARE (*)   ? All other components within normal limits  ?LIPASE, BLOOD  ?POC URINE PREG, ED  ?SURGICAL PATHOLOGY  ? ? ? ?EKG ? ? ?RADIOLOGY ? ?CT abdomen pelvis on my review shows evidence of an acute appendicitis without perforation or abscess.  I do not see diverticulitis, kidney stone, pancreatitis or other acute abdominal or pelvic process.  I reviewed radiology interpretation and agree to findings of severe hepatic steatosis and some submucosal fat deposition also reflecting sequelae of chronic inflammation.  No other acute abdominal or pelvic process. ? ?PROCEDURES: ? ?Critical Care performed: No ? ?.1-3 Lead EKG Interpretation ?Performed by: Gilles Chiquito, MD ?Authorized by: Gilles Chiquito, MD  ? ?  Interpretation: normal   ?  ECG rate assessment: normal   ?  Rhythm: sinus rhythm   ?  Ectopy: none   ?  Conduction: normal   ? ?The patient is on the cardiac monitor to evaluate for evidence of arrhythmia and/or significant heart rate changes. ? ? ?MEDICATIONS ORDERED IN ED: ?Medications  ?dextrose 5 %-0.45 % sodium chloride infusion (has no administration in time range)  ?metroNIDAZOLE (FLAGYL) IVPB 500 mg (500 mg Intravenous New Bag/Given 10/07/21 1530)  ?0.9 % irrigation (POUR BTL) (1,000 mLs Irrigation Given 10/07/21 1537)  ?ondansetron (ZOFRAN) injection 4 mg (4 mg  Intravenous Not Given 10/07/21 1335)  ?lactated ringers bolus 1,000 mL (0 mLs Intravenous Stopped 10/07/21 1515)  ?morphine (PF) 4 MG/ML injection 4 mg (4 mg Intravenous Given 10/07/21 1326)  ?iohexol (OMNIPAQUE) 300 MG/ML solution 100 mL (100 mLs Intravenous Contrast Given 10/07/21 1408)  ?morphine (PF) 4 MG/ML injection 4 mg (4 mg Intravenous Given 10/07/21 1524)  ?cefTRIAXone (ROCEPHIN) 2 g in sodium chloride 0.9 % 100 mL IVPB (2 g Intravenous New Bag/Given 10/07/21 1522)  ? ? ? ?IMPRESSION / MDM / ASSESSMENT AND PLAN / ED COURSE  ?I reviewed the triage vital signs and the nursing notes. ?             ?               ? ?Differential diagnosis includes, but is not limited to appendicitis, diverticulitis, kidney stone, cholecystitis, torsion and pancreatitis. ? ?Lipase not suggestive of acute pancreatitis.  CMP shows a K of 3.4 and AST of 52 with ALT of 103 and T. bili of 1.5.  No other significant electrolyte or metabolic derangements.  BC shows WBC count 12.5 without evidence of acute anemia.  Pregnancy test is negative.  UA has some ketones and rare bacteria but otherwise does not appear infected. ? ? ?CT abdomen pelvis on my review shows evidence of an acute appendicitis without perforation or abscess.  I do not see diverticulitis, kidney stone, pancreatitis or other acute abdominal or pelvic process.  I reviewed radiology interpretation and agree to findings of severe hepatic steatosis and some submucosal fat deposition also reflecting sequelae of chronic inflammation.  No other acute abdominal or pelvic process. ? ?Given concern for acute appendicitis I consulted with on-call surgeon Dr. Everlene Farrier will admit patient to surgery service for further evaluation and management. ? ?  ? ? ?FINAL CLINICAL IMPRESSION(S) / ED DIAGNOSES  ? ?Final diagnoses:  ?Acute appendicitis with localized peritonitis, without perforation, abscess, or gangrene  ?Steatosis (HCC)  ? ? ? ?Rx / DC Orders  ? ?ED Discharge Orders   ? ? None  ? ?   ? ? ? ?Note:  This document was prepared using Dragon voice recognition software and may include unintentional dictation errors. ?  ?Gilles Chiquito, MD ?10/07/21 1556 ? ?

## 2021-10-08 ENCOUNTER — Encounter: Payer: Self-pay | Admitting: Surgery

## 2021-10-08 NOTE — Anesthesia Postprocedure Evaluation (Signed)
Anesthesia Post Note ? ?Patient: Diana Hickman ? ?Procedure(s) Performed: APPENDECTOMY LAPAROSCOPIC (Abdomen) ? ?Patient location during evaluation: PACU ?Anesthesia Type: General ?Level of consciousness: awake and alert ?Pain management: pain level controlled ?Vital Signs Assessment: post-procedure vital signs reviewed and stable ?Respiratory status: spontaneous breathing, nonlabored ventilation, respiratory function stable and patient connected to nasal cannula oxygen ?Cardiovascular status: blood pressure returned to baseline and stable ?Postop Assessment: no apparent nausea or vomiting ?Anesthetic complications: no ? ? ?No notable events documented. ? ? ?Last Vitals:  ?Vitals:  ? 10/07/21 1700 10/07/21 1718  ?BP: 138/85 135/83  ?Pulse: 88 86  ?Resp: 17 15  ?Temp: 36.5 ?C (!) 36.1 ?C  ?SpO2: 97% 98%  ?  ?Last Pain:  ?Vitals:  ? 10/07/21 1718  ?TempSrc: Temporal  ?PainSc: 3   ? ? ?  ?  ?  ?  ?  ?  ? ?Lenard Simmer ? ? ? ? ?

## 2021-10-09 LAB — SURGICAL PATHOLOGY

## 2021-10-18 ENCOUNTER — Encounter: Payer: Self-pay | Admitting: Physician Assistant

## 2021-10-18 ENCOUNTER — Ambulatory Visit (INDEPENDENT_AMBULATORY_CARE_PROVIDER_SITE_OTHER): Payer: BC Managed Care – PPO | Admitting: Physician Assistant

## 2021-10-18 VITALS — BP 115/84 | HR 101 | Temp 97.5°F | Ht 65.0 in | Wt 205.2 lb

## 2021-10-18 DIAGNOSIS — K3580 Unspecified acute appendicitis: Secondary | ICD-10-CM

## 2021-10-18 DIAGNOSIS — Z09 Encounter for follow-up examination after completed treatment for conditions other than malignant neoplasm: Secondary | ICD-10-CM

## 2021-10-18 NOTE — Patient Instructions (Signed)

## 2021-10-18 NOTE — Progress Notes (Signed)
Diana Hickman  10/18/2021  HPI: Diana Hickman is a 26 y.o. female 11 days s/p laparoscopic appendectomy with Dr Everlene Farrier   She is doing well Some soreness at umbilicus but not needing any pain medications No fever, chills, nausea, emesis, or bowel changes Incisions are healing well; she is bruised No issues with PO intake  No other complaints   Vital signs: LMP 10/06/2021 (Approximate) Comment: neg preg test today   Physical Exam: Constitutional: Well appearing female, NAD Abdomen: Soft, non-tender, non-distended, no rebound/guarding Skin: Laparoscopic incisions are healing well, no erythema or drainage. She does have ecchymosis to all three incisions, worse at umbilicus   Assessment/Plan: This is a 26 y.o. female 11 days s/p laparoscopic appendectomy    - Pain control prn  - Reviewed wound care recommendation  - Reviewed lifting restrictions; 4 weeks total; work note given   - Reviewed surgical pathology; Appendicitis  - She can follow up on as needed basis; She understands to call with questions/concerns  -- Diana Oxford, PA-C [facility] 10/18/2021, 2:44 PM M-F: 7am - 4pm

## 2021-10-30 ENCOUNTER — Encounter: Payer: Self-pay | Admitting: Surgery

## 2022-06-07 ENCOUNTER — Ambulatory Visit: Payer: BC Managed Care – PPO | Admitting: Obstetrics & Gynecology

## 2022-06-07 ENCOUNTER — Encounter: Payer: Self-pay | Admitting: Obstetrics & Gynecology

## 2022-06-07 VITALS — BP 120/80 | Ht 65.0 in | Wt 210.0 lb

## 2022-06-07 DIAGNOSIS — N926 Irregular menstruation, unspecified: Secondary | ICD-10-CM | POA: Diagnosis not present

## 2022-06-07 DIAGNOSIS — Z113 Encounter for screening for infections with a predominantly sexual mode of transmission: Secondary | ICD-10-CM

## 2022-06-07 MED ORDER — NORETHINDRONE 0.35 MG PO TABS: 1.0000 | ORAL_TABLET | Freq: Every day | ORAL | 11 refills | Status: AC

## 2022-06-07 NOTE — Progress Notes (Signed)
   Established Patient Office Visit  Subjective   Patient ID: Diana Hickman, female    DOB: September 24, 1995  Age: 27 y.o. MRN: [id number]  Chief Complaint  Patient presents with   Contraception    HPI    27 yo engaged G0 here because she thinks that she wants her 27 yo Nexplanon removed due to new onset heavy bleeding for the last 4 months. She had a Nexplanon for 3 years with no bleeding and then got another Nexplanon.  She also reports new onset pelvic pain.  Objective:     BP 120/80   Ht 5\' 5"  (1.651 m)   Wt 210 lb (95.3 kg)   LMP 05/27/2022   BMI 34.95 kg/m    Physical Exam Well nourished, well hydrated White female, no apparent distress She is ambulating and conversing normally. EG- normal Swab for GC/CT obtained    Assessment & Plan:   Problem List Items Addressed This Visit   None Visit Diagnoses     Encounter for Nexplanon removal    -  Primary     Irregular bleeding with Nexplanon, new onset- check TSH, CT/GC I offered removal of Nexplanon versus trial of POPs with nexplanon to hopefully stop her bleeding. She does not want a pregnancy now. She used OCPs in the past but got some headaches.   She wants to try the POPs prior to having the Nexplanon removed.  Check TSH today.  If no help with POPs, rec gyn ultrasound. Come back 2 months/prn sooner No follow-ups on file.    Emily Filbert, MD

## 2022-06-07 NOTE — Addendum Note (Signed)
Addended by: Drenda Freeze on: 06/07/2022 02:04 PM   Modules accepted: Orders

## 2022-06-08 LAB — TSH+FREE T4
Free T4: 1.61 ng/dL (ref 0.82–1.77)
TSH: 6.22 u[IU]/mL — ABNORMAL HIGH (ref 0.450–4.500)

## 2022-06-10 LAB — GC/CHLAMYDIA PROBE AMP
Chlamydia trachomatis, NAA: NEGATIVE
Neisseria Gonorrhoeae by PCR: NEGATIVE

## 2022-07-15 ENCOUNTER — Ambulatory Visit
Admission: EM | Admit: 2022-07-15 | Discharge: 2022-07-15 | Disposition: A | Discharge status: Home / Self Care | Payer: BC Managed Care – PPO | Attending: Urgent Care | Admitting: Urgent Care

## 2022-07-15 DIAGNOSIS — S0185XA Open bite of other part of head, initial encounter: Secondary | ICD-10-CM | POA: Diagnosis not present

## 2022-07-15 MED ORDER — AMOXICILLIN-POT CLAVULANATE 875-125 MG PO TABS: 1.0000 | ORAL_TABLET | Freq: Two times a day (BID) | ORAL | 0 refills | Status: AC

## 2022-07-15 NOTE — ED Provider Notes (Signed)
[facility]    CSN: [id number] Arrival date & time: 07/15/22  1244      History   Chief Complaint Chief Complaint  Patient presents with   Animal Bite    HPI Diana Hickman is a 27 y.o. female.    Animal Bite   Patient presents to urgent care with dog bite to left upper lip, 2 days ago.  She states dog is up-to-date on rabies.  Tdap obtained yesterday at the pharmacy.  Patient is concerned with possible infection. She endorses production of purulent drainage from the puncture wound.  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      Home Medications    Prior to Admission medications   Medication Sig Start Date End Date Taking? Authorizing Provider  ALPRAZolam Duanne Moron) 1 MG tablet Take 1 mg by mouth at bedtime as needed for anxiety.    [provider]  etonogestrel (NEXPLANON) 68 MG IMPL implant Inject into the skin.    [provider]  norethindrone (ORTHO MICRONOR) 0.35 MG tablet Take 1 tablet (0.35 mg total) by mouth daily. 06/07/22   Emily Filbert, MD  omeprazole (PRILOSEC) 40 MG capsule Take by mouth. 12/10/21 12/10/22  [provider]  sertraline (ZOLOFT) 100 MG tablet Take 100 mg by mouth daily. 10/02/21   [provider]    Family History History reviewed. No pertinent family history.  Social History Social History   Tobacco Use   Smoking status: Never    Smokeless tobacco: Never  Vaping Use   Vaping Use: Never used  Substance Use Topics   Alcohol use: Yes    Comment: occ   Drug use: Never     Allergies   Wound dressing adhesive, Adhesive [tape], Doxycycline, Latex, and Silicone   Review of Systems Review of Systems   Physical Exam Triage Vital Signs ED Triage Vitals [07/15/22 1302]  Enc Vitals Group     BP 132/85     Pulse Rate 68     Resp 16     Temp 98.1 F (36.7 C)     Temp Source Oral     SpO2 98 %     Weight      Height      Head Circumference      Peak Flow      Pain Score      Pain Loc      Pain Edu?      Excl. in [place]?    No data found.  Updated Vital Signs BP 132/85 (BP Location: Right Arm)   Pulse 68   Temp 98.1 F (36.7 C) (Oral)   Resp 16   SpO2 98%   Visual Acuity Right Eye Distance:   Left Eye Distance:   Bilateral Distance:    Right Eye Near:   Left Eye Near:    Bilateral Near:  Physical Exam Vitals reviewed.  Constitutional:      Appearance: Normal appearance.  HENT:     Mouth/Throat:   Skin:    General: Skin is warm and dry.  Neurological:     General: No focal deficit present.     Mental Status: She is alert and oriented to person, place, and time.  Psychiatric:        Mood and Affect: Mood normal.        Behavior: Behavior normal.      UC Treatments / Results  Labs (all labs ordered are listed, but only abnormal results are displayed) Labs Reviewed - No data to display  EKG   Radiology No results found.  Procedures Procedures (including critical care time)  Medications Ordered in UC Medications - No data to display  Initial Impression / Assessment and Plan / UC Course  I have reviewed the triage vital signs and the nursing notes.  Pertinent labs & imaging results that were available during my care of the patient were reviewed by me and considered in my medical decision making (see chart for details).   Erythema and edema 2/2 dog bite. Concern for  infection. Will treat with augmentin x 7 days and ask her to return for evaluation if symptoms do not significantly improve.   Final Clinical Impressions(s) / UC Diagnoses   Final diagnoses:  None   Discharge Instructions   None    ED Prescriptions   None    PDMP not reviewed this encounter.   Rose Phi, [place] 07/15/22 1316

## 2022-07-15 NOTE — Discharge Instructions (Addendum)
Please follow-up with return visit in 1 week if symptoms do not significantly improve.

## 2022-07-15 NOTE — ED Triage Notes (Signed)
Patient presents to UC for dog bite to left upper lip area on Saturday. States this is her friends dog and up to date on rabies vaccines. Last Tdap yesterday. Concerned with infection.

## 2022-07-30 ENCOUNTER — Ambulatory Visit: Payer: BC Managed Care – PPO | Admitting: Obstetrics & Gynecology
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
## 2023-04-07 ENCOUNTER — Ambulatory Visit: Payer: BC Managed Care – PPO | Admitting: Obstetrics & Gynecology

## 2023-04-07 DIAGNOSIS — Z30017 Encounter for initial prescription of implantable subdermal contraceptive: Secondary | ICD-10-CM

## 2023-06-12 ENCOUNTER — Ambulatory Visit: Payer: Commercial Managed Care - PPO | Admitting: Obstetrics & Gynecology

## 2023-06-12 VITALS — BP 122/72 | HR 80 | Ht 65.0 in | Wt 221.5 lb

## 2023-06-12 DIAGNOSIS — Z30017 Encounter for initial prescription of implantable subdermal contraceptive: Secondary | ICD-10-CM

## 2023-06-12 DIAGNOSIS — Z3046 Encounter for surveillance of implantable subdermal contraceptive: Secondary | ICD-10-CM

## 2023-06-12 MED ORDER — ETONOGESTREL 68 MG ~~LOC~~ IMPL
68.0000 mg | DRUG_IMPLANT | Freq: Once | SUBCUTANEOUS | Status: AC
  Administered 2023-06-12: 68 mg via SUBCUTANEOUS

## 2023-06-12 NOTE — Addendum Note (Signed)
 Addended by: Sheliah Hatch on: 06/12/2023 03:34 PM   Modules accepted: Orders

## 2023-06-12 NOTE — Progress Notes (Signed)
    GYNECOLOGY PROGRESS NOTE  Subjective:    Patient ID: Nena DELENA Glenn, female    DOB: 02-06-1996, 28 y.o.   MRN: [id number]  HPI  Patient is a 28 y.o. P0 here for replacement of her 28 year old Nexplanon . This will be her third Nexplanon . Last year she had some increased bleeding with the Nexplanon  and this was resolved with the use of 1 month of progesterone only OCPs. She does not have any concerns today.  The following portions of the patient's history were reviewed and updated as appropriate: allergies, current medications, past family history, past medical history, past social history, past surgical history, and problem list.  Review of Systems Pertinent items are noted in HPI.  Pap normal 2022.  Objective:   Blood pressure 122/72, pulse 80, height 5' 5 (1.651 m), weight 221 lb 8 oz (100.5 kg), last menstrual period 06/10/2023. Body mass index is 36.86 kg/m. Well nourished, well hydrated White female, no apparent distress She is ambulating and conversing normally. Consent was signed and time out was done. Her left arm was prepped with betadine after establishing the position of the Nexplanon . The area was infiltrated with 2 cc of 1% lidocaine . A small incision was made and the intact rod was easily removed and noted to be intact.  I then placed a new Nexplanon  in the opening in the usual fashion. 3 Steristrips were placed and her arm was noted to be hemostatic. It was bandaged.  She tolerated the procedure well.   Assessment:   Replacement of 28 yo Nexplanon   Plan:   Rec annual exam next year

## 2023-12-19 ENCOUNTER — Other Ambulatory Visit: Payer: Self-pay | Admitting: Obstetrics & Gynecology

## 2023-12-29 ENCOUNTER — Telehealth: Payer: Self-pay

## 2023-12-29 NOTE — Telephone Encounter (Signed)
 SABRA

## 2024-01-29 IMAGING — CT CT ABD-PELV W/ CM
2 of 4 series · 16 of 46 positions shown, 18 images · IV contrast (APPLIED)
Comparison: None Available.

CLINICAL DATA: Abdominal pain, acute, nonlocalized. Right lower
quadrant pain

EXAM:
CT ABDOMEN AND PELVIS WITH CONTRAST
TECHNIQUE: Multidetector CT imaging of the abdomen and pelvis was performed
using the standard protocol following bolus administration of
intravenous contrast.

[Series 2: abdomen 5.0 · axial · 0.80mm/px · z∈[-951,-496]mm · 13 of 103 slices shown, 15 images]
[im 6/103  soft-tissue]
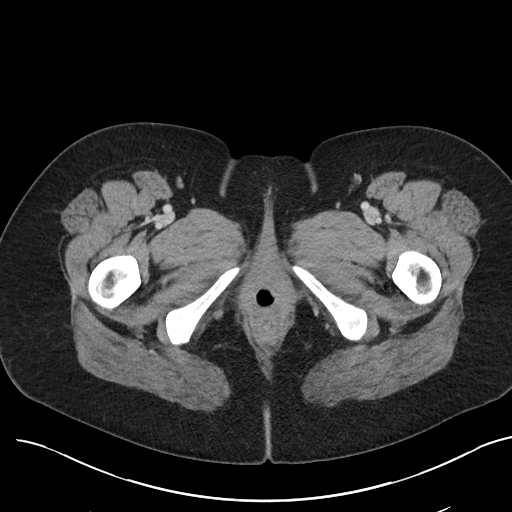
[im 6/103  bone]
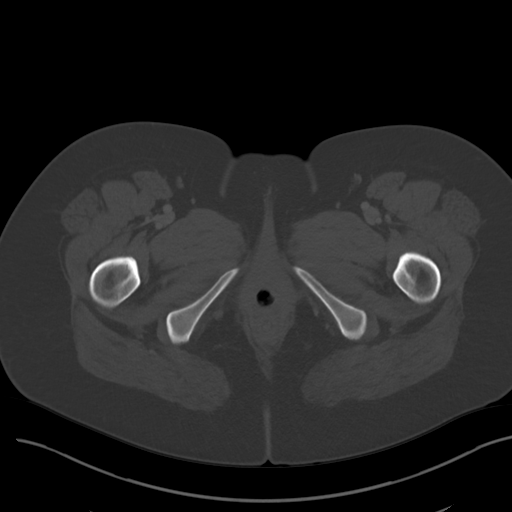
[im 12/103  soft-tissue]
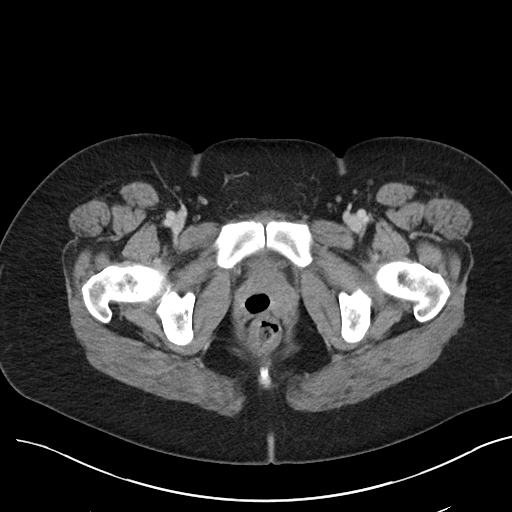
[im 23/103  soft-tissue]
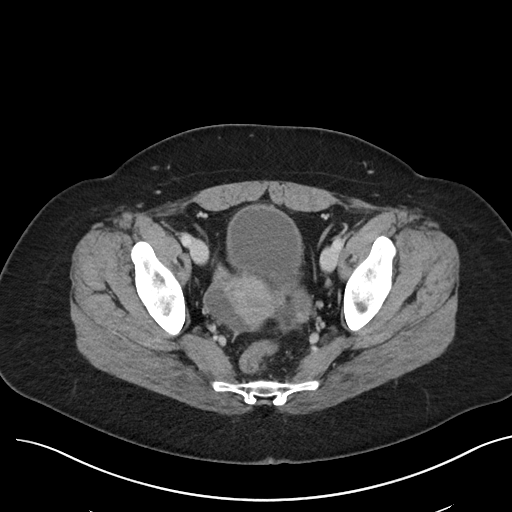
[im 29/103  soft-tissue]
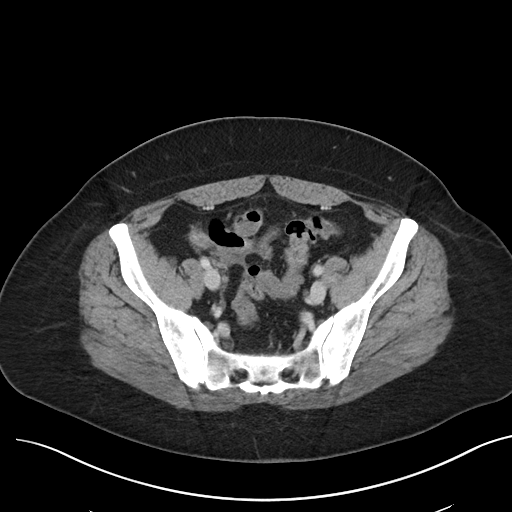
[im 35/103  soft-tissue]
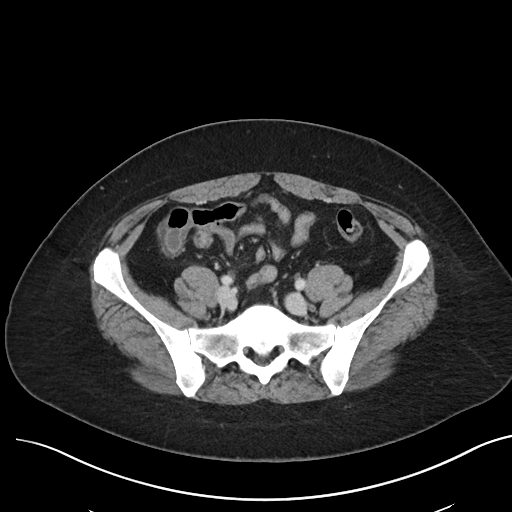
[im 46/103  soft-tissue]
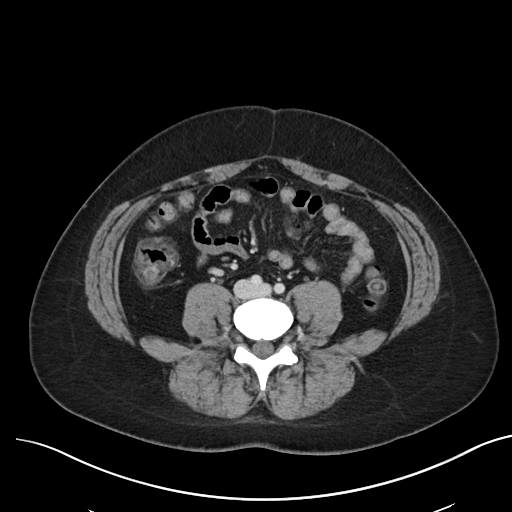
[im 52/103  soft-tissue]
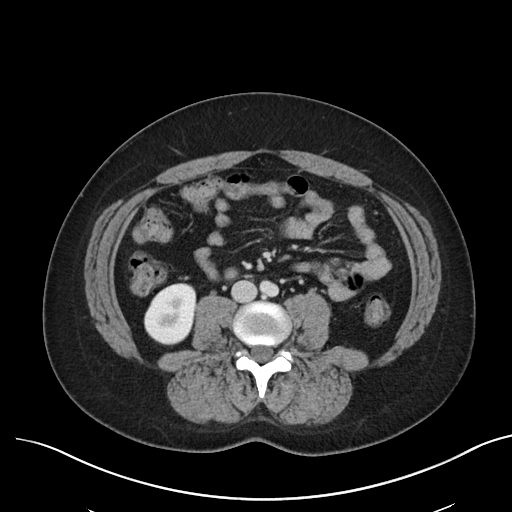
[im 57/103  soft-tissue]
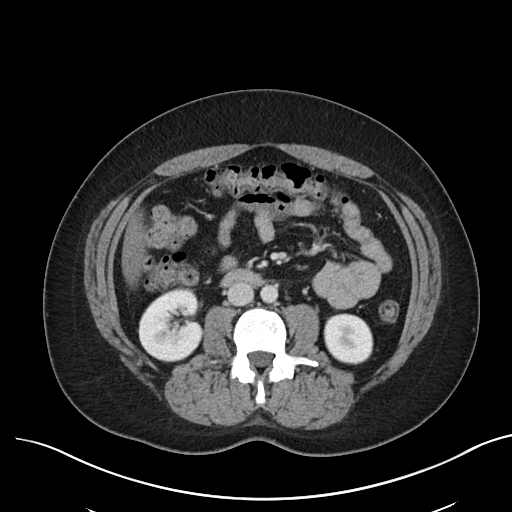
[im 69/103  soft-tissue]
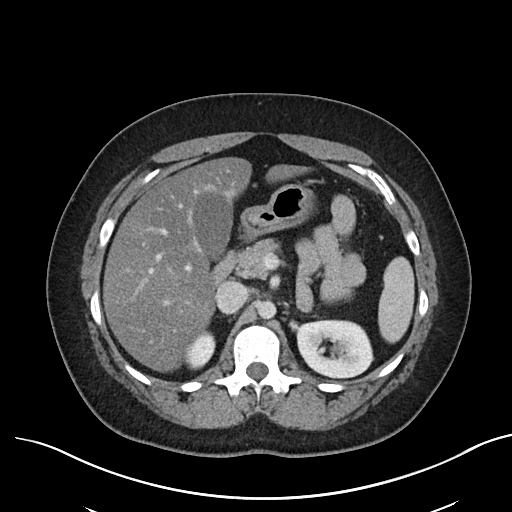
[im 69/103  bone]
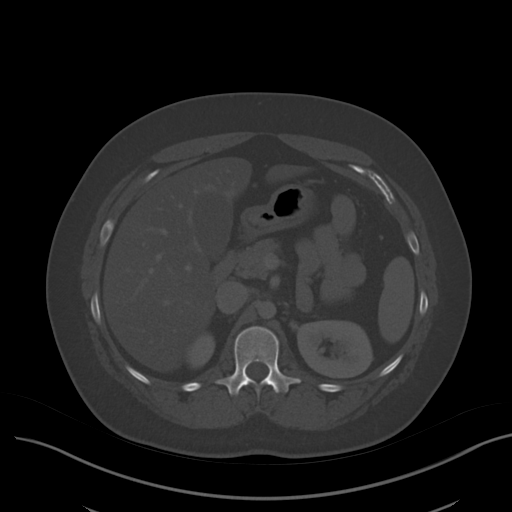
[im 74/103  soft-tissue]
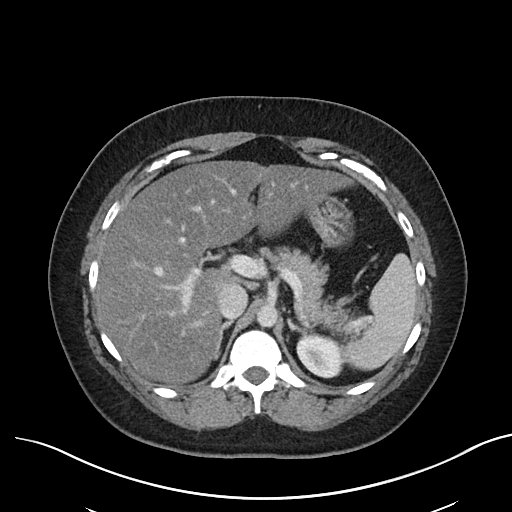
[im 80/103  soft-tissue]
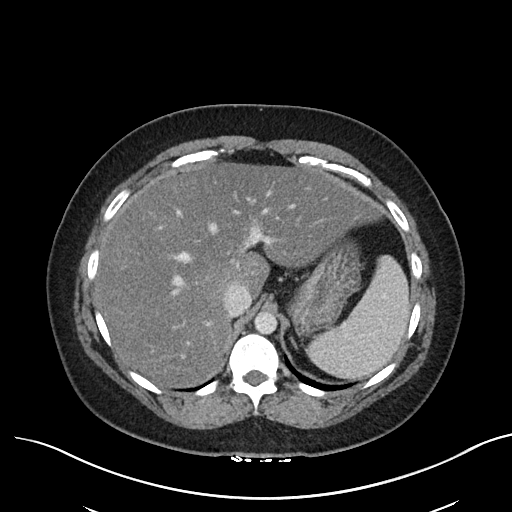
[im 91/103  soft-tissue]
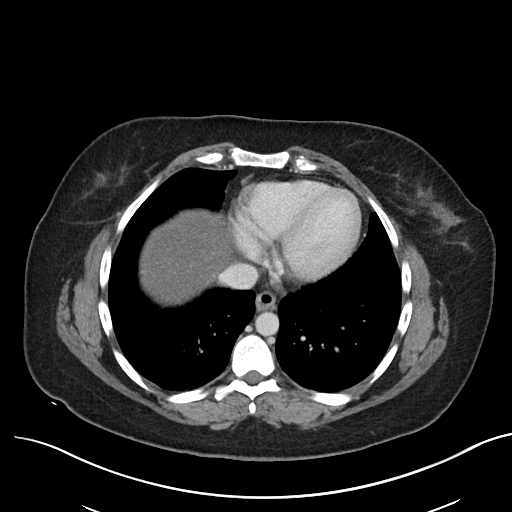
[im 97/103  soft-tissue]
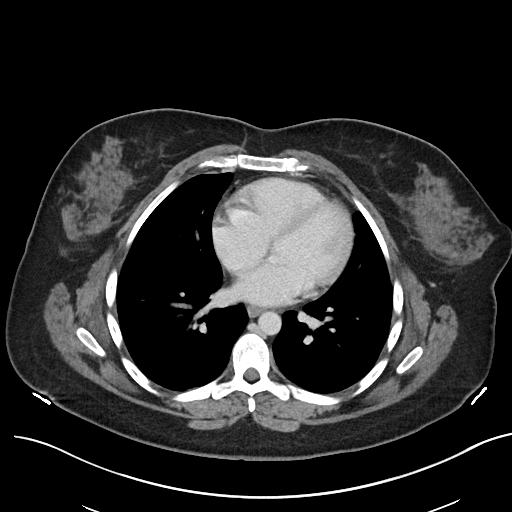

[Series 5: abdomen 3.0 mpr cor · coronal · 0.74mm/px · 3 of 103 slices shown]
[im 35/103  soft-tissue]
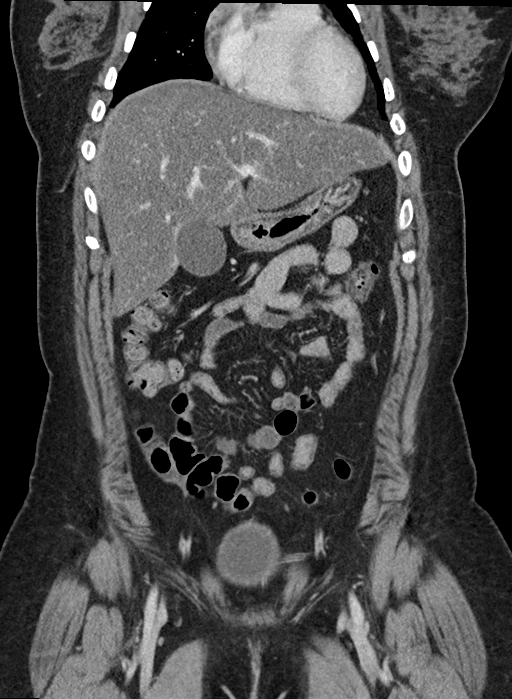
[im 46/103  soft-tissue]
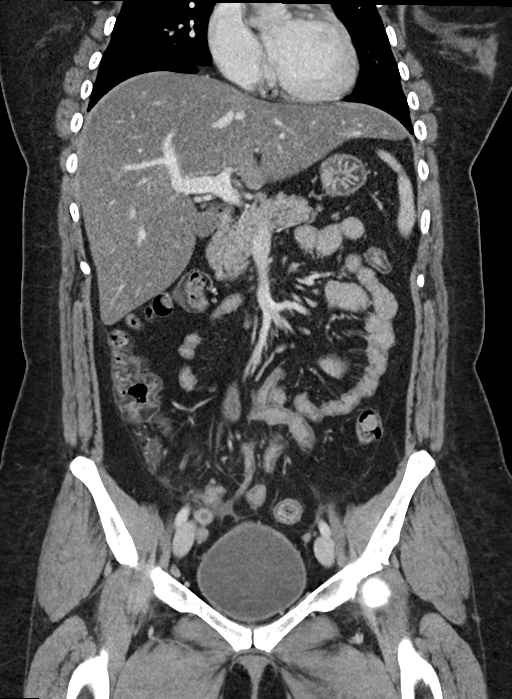
[im 57/103  soft-tissue]
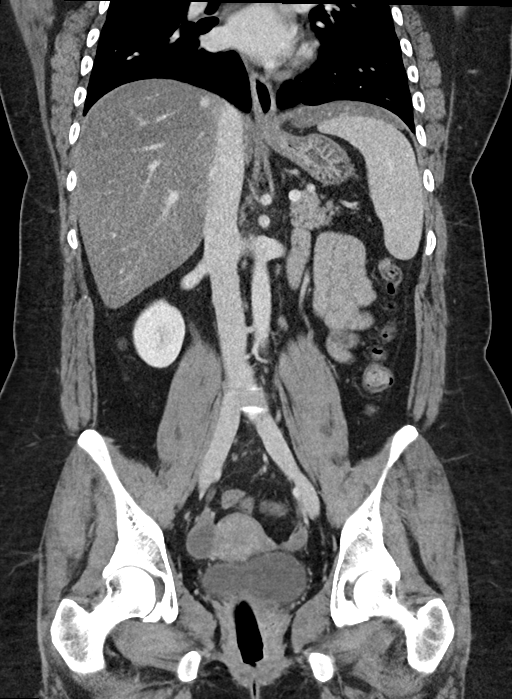

[16 of 46 positions shown; findings below may reference images not displayed]

RADIATION DOSE REDUCTION: This exam was performed according to the
departmental dose-optimization program which includes automated
exposure control, adjustment of the mA and/or kV according to
patient size and/or use of iterative reconstruction technique.

CONTRAST:  100mL OMNIPAQUE IOHEXOL 300 MG/ML  SOLN
FINDINGS: Lower chest: No acute abnormality.

Hepatobiliary: Severe hepatic steatosis. No focal liver lesion
identified. Unremarkable gallbladder. No hyperdense gallstone.

Pancreas: Unremarkable. No pancreatic ductal dilatation or
surrounding inflammatory changes.

Spleen: Normal in size without focal abnormality.

Adrenals/Urinary Tract: Unremarkable adrenal glands. Kidneys enhance
symmetrically without focal lesion, stone, or hydronephrosis.
Ureters are nondilated. Urinary bladder appears unremarkable.

Stomach/Bowel: Prominent appendix in the right lower quadrant with
mucosal hyperenhancement and periappendiceal fat stranding and trace
fluid. No organized or rim enhancing Peri appendiceal fluid
collection. No extraluminal air. Submucosal fat deposition within
the colon. No dilated loops of bowel. Stomach within normal limits.

Vascular/Lymphatic: No significant vascular findings are present. No
enlarged abdominal or pelvic lymph nodes.

Reproductive: Uterus and bilateral adnexa are unremarkable.

Other: No organized abdominopelvic fluid collection. No
pneumoperitoneum. No abdominal wall hernia.

Musculoskeletal: No acute or significant osseous findings.
IMPRESSION: 1. Acute uncomplicated appendicitis.
2. Severe hepatic steatosis.
3. Submucosal fat deposition within the colon suggesting sequela of
chronic inflammation.

## 2024-02-04 ENCOUNTER — Encounter (INDEPENDENT_AMBULATORY_CARE_PROVIDER_SITE_OTHER): Payer: Self-pay

## 2024-07-02 ENCOUNTER — Ambulatory Visit: Admitting: Licensed Practical Nurse
